# Patient Record
Sex: Female | Born: 2000 | Race: Black or African American | Hispanic: No | Marital: Single | Smoking: Never smoker
Health system: Southern US, Community
[De-identification: ages and names within clinical notes are randomized; demographics above are authoritative.]

## PROBLEM LIST (undated history)

## (undated) ENCOUNTER — Emergency Department (HOSPITAL_COMMUNITY): Admission: EM | Disposition: A | Discharge status: Other Acute Inpatient Hospital | Payer: Medicaid Other

## (undated) ENCOUNTER — Emergency Department (HOSPITAL_COMMUNITY): Admission: EM | Payer: Medicaid Other | Source: Home / Self Care

## (undated) DIAGNOSIS — E109 Type 1 diabetes mellitus without complications: Secondary | ICD-10-CM

---
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
## 2022-01-01 ENCOUNTER — Other Ambulatory Visit: Payer: Self-pay | Admitting: *Deleted

## 2022-01-01 ENCOUNTER — Telehealth: Payer: Self-pay | Admitting: Obstetrics and Gynecology

## 2022-01-01 DIAGNOSIS — A599 Trichomoniasis, unspecified: Secondary | ICD-10-CM

## 2022-01-01 DIAGNOSIS — E1065 Type 1 diabetes mellitus with hyperglycemia: Secondary | ICD-10-CM | POA: Diagnosis not present

## 2022-01-01 MED ORDER — METRONIDAZOLE 500 MG PO TABS: 500.0000 mg | ORAL_TABLET | Freq: Two times a day (BID) | ORAL | 0 refills | Status: DC

## 2022-01-01 NOTE — Telephone Encounter (Signed)
Patient called to say she had moved and lost her medication flagyl. She is requesting another prescription.

## 2022-01-02 ENCOUNTER — Ambulatory Visit (HOSPITAL_COMMUNITY): Payer: Self-pay

## 2022-01-20 ENCOUNTER — Emergency Department (HOSPITAL_COMMUNITY): Payer: Medicaid Other

## 2022-01-20 ENCOUNTER — Encounter (HOSPITAL_COMMUNITY): Payer: Self-pay

## 2022-01-20 ENCOUNTER — Emergency Department (HOSPITAL_COMMUNITY)
Admission: EM | Admit: 2022-01-20 | Discharge: 2022-01-20 | Discharge status: Against Medical Advice | Payer: Medicaid Other | Attending: Emergency Medicine | Admitting: Emergency Medicine

## 2022-01-20 DIAGNOSIS — R739 Hyperglycemia, unspecified: Secondary | ICD-10-CM | POA: Diagnosis not present

## 2022-01-20 DIAGNOSIS — R1030 Lower abdominal pain, unspecified: Secondary | ICD-10-CM | POA: Diagnosis not present

## 2022-01-20 DIAGNOSIS — N888 Other specified noninflammatory disorders of cervix uteri: Secondary | ICD-10-CM | POA: Diagnosis not present

## 2022-01-20 DIAGNOSIS — R102 Pelvic and perineal pain: Secondary | ICD-10-CM | POA: Diagnosis not present

## 2022-01-20 DIAGNOSIS — R1084 Generalized abdominal pain: Secondary | ICD-10-CM | POA: Diagnosis not present

## 2022-01-20 DIAGNOSIS — Z5321 Procedure and treatment not carried out due to patient leaving prior to being seen by health care provider: Secondary | ICD-10-CM | POA: Insufficient documentation

## 2022-01-20 DIAGNOSIS — R1111 Vomiting without nausea: Secondary | ICD-10-CM | POA: Diagnosis not present

## 2022-01-20 DIAGNOSIS — N838 Other noninflammatory disorders of ovary, fallopian tube and broad ligament: Secondary | ICD-10-CM | POA: Diagnosis not present

## 2022-01-20 DIAGNOSIS — I10 Essential (primary) hypertension: Secondary | ICD-10-CM | POA: Diagnosis not present

## 2022-01-20 LAB — URINALYSIS, ROUTINE W REFLEX MICROSCOPIC
Bilirubin Urine: NEGATIVE
Glucose, UA: >=500 mg/dL — AB
Ketones, ur: 80 mg/dL — AB
Nitrite: NEGATIVE
Protein, ur: 30 mg/dL — AB
RBC / HPF: >50 RBC/hpf — ABNORMAL HIGH (ref 0–5)
Specific Gravity, Urine: 1.028 (ref 1.005–1.030)
WBC, UA: >50 WBC/hpf — ABNORMAL HIGH (ref 0–5)
pH: 8 (ref 5.0–8.0)

## 2022-01-20 NOTE — ED Provider Triage Note (Signed)
Emergency Medicine Provider Triage Evaluation Note  Stacy Wood , a 21 y.o. female  was evaluated in triage.  Pt complains of pelvic pain since rough intercourse last night. Is on birth control. No discharge, dysuria. No hx of ovarian cysts. Denies nausea, vomiting at this time.  Review of Systems  Positive: Pelvic pain Negative: Nausea, vomiting, dysuria  Physical Exam  BP (!) 133/91 (BP Location: Left Arm)    Pulse 81    Temp 98 F (36.7 C) (Oral)    Resp 16    SpO2 100%  Gen:   Awake, no distress   Resp:  Normal effort  MSK:   Moves extremities without difficulty  Other:  TTP suprapubic, not more localized one side vs. The other  Medical Decision Making  Medically screening exam initiated at 1:28 PM.  Appropriate orders placed.  Stacy Wood was informed that the remainder of the evaluation will be completed by another provider, this initial triage assessment does not replace that evaluation, and the importance of remaining in the ED until their evaluation is complete.  Pelvic pain   Olene Floss, PA-C 01/20/22 1330

## 2022-01-20 NOTE — ED Triage Notes (Signed)
Per EMS, patient from walking on the street, c/o lower abdominal pain after intercourse yesterday. Pain worsened this morning per patient. Also reports vomiting today.  CBG 250

## 2022-01-30 DIAGNOSIS — E1065 Type 1 diabetes mellitus with hyperglycemia: Secondary | ICD-10-CM | POA: Diagnosis not present

## 2023-04-24 ENCOUNTER — Inpatient Hospital Stay (HOSPITAL_COMMUNITY)
Admission: EM | Admit: 2023-04-24 | Discharge: 2023-04-26 | DRG: 919 | Disposition: A | Discharge status: Home / Self Care | Payer: Medicaid Other | Attending: Infectious Diseases | Admitting: Infectious Diseases

## 2023-04-24 ENCOUNTER — Other Ambulatory Visit: Payer: Self-pay

## 2023-04-24 ENCOUNTER — Inpatient Hospital Stay (HOSPITAL_COMMUNITY): Payer: Medicaid Other

## 2023-04-24 ENCOUNTER — Emergency Department (HOSPITAL_COMMUNITY): Payer: Medicaid Other

## 2023-04-24 ENCOUNTER — Encounter (HOSPITAL_COMMUNITY): Payer: Self-pay

## 2023-04-24 DIAGNOSIS — Z825 Family history of asthma and other chronic lower respiratory diseases: Secondary | ICD-10-CM | POA: Diagnosis not present

## 2023-04-24 DIAGNOSIS — T383X6A Underdosing of insulin and oral hypoglycemic [antidiabetic] drugs, initial encounter: Secondary | ICD-10-CM | POA: Diagnosis present

## 2023-04-24 DIAGNOSIS — D509 Iron deficiency anemia, unspecified: Secondary | ICD-10-CM | POA: Diagnosis present

## 2023-04-24 DIAGNOSIS — T85614A Breakdown (mechanical) of insulin pump, initial encounter: Principal | ICD-10-CM | POA: Diagnosis present

## 2023-04-24 DIAGNOSIS — E875 Hyperkalemia: Secondary | ICD-10-CM | POA: Diagnosis present

## 2023-04-24 DIAGNOSIS — D72825 Bandemia: Secondary | ICD-10-CM | POA: Diagnosis present

## 2023-04-24 DIAGNOSIS — B3731 Acute candidiasis of vulva and vagina: Secondary | ICD-10-CM | POA: Diagnosis present

## 2023-04-24 DIAGNOSIS — Z9641 Presence of insulin pump (external) (internal): Secondary | ICD-10-CM | POA: Diagnosis present

## 2023-04-24 DIAGNOSIS — Z8249 Family history of ischemic heart disease and other diseases of the circulatory system: Secondary | ICD-10-CM

## 2023-04-24 DIAGNOSIS — Z833 Family history of diabetes mellitus: Secondary | ICD-10-CM

## 2023-04-24 DIAGNOSIS — E860 Dehydration: Secondary | ICD-10-CM | POA: Diagnosis present

## 2023-04-24 DIAGNOSIS — D72829 Elevated white blood cell count, unspecified: Secondary | ICD-10-CM | POA: Diagnosis not present

## 2023-04-24 DIAGNOSIS — E1110 Type 2 diabetes mellitus with ketoacidosis without coma: Secondary | ICD-10-CM | POA: Diagnosis present

## 2023-04-24 DIAGNOSIS — D75839 Thrombocytosis, unspecified: Secondary | ICD-10-CM | POA: Diagnosis present

## 2023-04-24 DIAGNOSIS — N390 Urinary tract infection, site not specified: Secondary | ICD-10-CM | POA: Diagnosis present

## 2023-04-24 DIAGNOSIS — E876 Hypokalemia: Secondary | ICD-10-CM | POA: Diagnosis present

## 2023-04-24 DIAGNOSIS — Z794 Long term (current) use of insulin: Secondary | ICD-10-CM | POA: Diagnosis not present

## 2023-04-24 DIAGNOSIS — N179 Acute kidney failure, unspecified: Secondary | ICD-10-CM | POA: Diagnosis not present

## 2023-04-24 DIAGNOSIS — Z79899 Other long term (current) drug therapy: Secondary | ICD-10-CM

## 2023-04-24 DIAGNOSIS — E874 Mixed disorder of acid-base balance: Secondary | ICD-10-CM | POA: Diagnosis not present

## 2023-04-24 DIAGNOSIS — E10649 Type 1 diabetes mellitus with hypoglycemia without coma: Secondary | ICD-10-CM

## 2023-04-24 DIAGNOSIS — E1010 Type 1 diabetes mellitus with ketoacidosis without coma: Secondary | ICD-10-CM | POA: Diagnosis present

## 2023-04-24 DIAGNOSIS — Z4681 Encounter for fitting and adjustment of insulin pump: Secondary | ICD-10-CM

## 2023-04-24 DIAGNOSIS — E1065 Type 1 diabetes mellitus with hyperglycemia: Secondary | ICD-10-CM | POA: Diagnosis present

## 2023-04-24 LAB — URINALYSIS, ROUTINE W REFLEX MICROSCOPIC
Bilirubin Urine: NEGATIVE
Glucose, UA: >=500 mg/dL — AB
Ketones, ur: 80 mg/dL — AB
Nitrite: NEGATIVE
Protein, ur: 100 mg/dL — AB
RBC / HPF: >50 RBC/hpf (ref 0–5)
Specific Gravity, Urine: 1.014 (ref 1.005–1.030)
WBC, UA: >50 WBC/hpf (ref 0–5)
pH: 5 (ref 5.0–8.0)

## 2023-04-24 LAB — BASIC METABOLIC PANEL
Anion gap: 15 (ref 5–15)
BUN: 13 mg/dL (ref 6–20)
CO2: 17 mmol/L — ABNORMAL LOW (ref 22–32)
Calcium: 9.4 mg/dL (ref 8.9–10.3)
Chloride: 111 mmol/L (ref 98–111)
Creatinine, Ser: 0.97 mg/dL (ref 0.44–1.00)
GFR, Estimated: >60 mL/min (ref >60)
Glucose, Bld: 129 mg/dL — ABNORMAL HIGH (ref 70–99)
Potassium: 4.5 mmol/L (ref 3.5–5.1)
Sodium: 143 mmol/L (ref 135–145)

## 2023-04-24 LAB — COMPREHENSIVE METABOLIC PANEL
ALT: UNDETERMINED U/L (ref 0–44)
AST: 25 U/L (ref 15–41)
Albumin: 3.8 g/dL (ref 3.5–5.0)
Alkaline Phosphatase: 153 U/L — ABNORMAL HIGH (ref 38–126)
BUN: 22 mg/dL — ABNORMAL HIGH (ref 6–20)
CO2: <7 mmol/L — ABNORMAL LOW (ref 22–32)
Calcium: 9.8 mg/dL (ref 8.9–10.3)
Chloride: 116 mmol/L — ABNORMAL HIGH (ref 98–111)
Creatinine, Ser: 1.69 mg/dL — ABNORMAL HIGH (ref 0.44–1.00)
GFR, Estimated: 44 mL/min — ABNORMAL LOW (ref >60)
Glucose, Bld: 255 mg/dL — ABNORMAL HIGH (ref 70–99)
Potassium: 5.3 mmol/L — ABNORMAL HIGH (ref 3.5–5.1)
Sodium: 145 mmol/L (ref 135–145)
Total Bilirubin: 1.9 mg/dL — ABNORMAL HIGH (ref 0.3–1.2)
Total Protein: 8.2 g/dL — ABNORMAL HIGH (ref 6.5–8.1)

## 2023-04-24 LAB — CBC WITH DIFFERENTIAL/PLATELET
Abs Immature Granulocytes: 0 10*3/uL (ref 0.00–0.07)
Basophils Absolute: 0 10*3/uL (ref 0.0–0.1)
Basophils Relative: 0 %
Eosinophils Absolute: 0.4 10*3/uL (ref 0.0–0.5)
Eosinophils Relative: 1 %
HCT: 48.6 % — ABNORMAL HIGH (ref 36.0–46.0)
Hemoglobin: 13.7 g/dL (ref 12.0–15.0)
Lymphocytes Relative: 11 %
Lymphs Abs: 4.5 10*3/uL — ABNORMAL HIGH (ref 0.7–4.0)
MCH: 24.5 pg — ABNORMAL LOW (ref 26.0–34.0)
MCHC: 28.2 g/dL — ABNORMAL LOW (ref 30.0–36.0)
MCV: 86.9 fL (ref 80.0–100.0)
Monocytes Absolute: 0.8 10*3/uL (ref 0.1–1.0)
Monocytes Relative: 2 %
Neutro Abs: 35.5 10*3/uL — ABNORMAL HIGH (ref 1.7–7.7)
Neutrophils Relative %: 86 %
Platelets: 411 10*3/uL — ABNORMAL HIGH (ref 150–400)
RBC: 5.59 MIL/uL — ABNORMAL HIGH (ref 3.87–5.11)
RDW: 13.4 % (ref 11.5–15.5)
WBC: 41.3 10*3/uL — ABNORMAL HIGH (ref 4.0–10.5)
nRBC: 0 % (ref 0.0–0.2)
nRBC: 0 /100 WBC

## 2023-04-24 LAB — I-STAT VENOUS BLOOD GAS, ED
Acid-base deficit: 26 mmol/L — ABNORMAL HIGH (ref 0.0–2.0)
Acid-base deficit: 20 mmol/L — ABNORMAL HIGH (ref 0.0–2.0)
Bicarbonate: 5.2 mmol/L — ABNORMAL LOW (ref 20.0–28.0)
Bicarbonate: 6.7 mmol/L — ABNORMAL LOW (ref 20.0–28.0)
Calcium, Ion: 1.08 mmol/L — ABNORMAL LOW (ref 1.15–1.40)
Calcium, Ion: 1.07 mmol/L — ABNORMAL LOW (ref 1.15–1.40)
HCT: 51 % — ABNORMAL HIGH (ref 36.0–46.0)
HCT: 47 % — ABNORMAL HIGH (ref 36.0–46.0)
Hemoglobin: 17.3 g/dL — ABNORMAL HIGH (ref 12.0–15.0)
Hemoglobin: 16 g/dL — ABNORMAL HIGH (ref 12.0–15.0)
O2 Saturation: 98 %
O2 Saturation: 56 %
Potassium: 7.3 mmol/L (ref 3.5–5.1)
Potassium: 4.7 mmol/L (ref 3.5–5.1)
Sodium: 137 mmol/L (ref 135–145)
Sodium: 144 mmol/L (ref 135–145)
TCO2: 6 mmol/L — ABNORMAL LOW (ref 22–32)
TCO2: 7 mmol/L — ABNORMAL LOW (ref 22–32)
pCO2, Ven: 23.7 mmHg — ABNORMAL LOW (ref 44–60)
pCO2, Ven: 19.4 mmHg — CL (ref 44–60)
pH, Ven: 6.946 — CL (ref 7.25–7.43)
pH, Ven: 7.15 — CL (ref 7.25–7.43)
pO2, Ven: 156 mmHg — ABNORMAL HIGH (ref 32–45)
pO2, Ven: 37 mmHg (ref 32–45)

## 2023-04-24 LAB — TECHNOLOGIST SMEAR REVIEW

## 2023-04-24 LAB — I-STAT CHEM 8, ED
BUN: 39 mg/dL — ABNORMAL HIGH (ref 6–20)
BUN: 25 mg/dL — ABNORMAL HIGH (ref 6–20)
Calcium, Ion: 1.1 mmol/L — ABNORMAL LOW (ref 1.15–1.40)
Calcium, Ion: 1.1 mmol/L — ABNORMAL LOW (ref 1.15–1.40)
Chloride: 116 mmol/L — ABNORMAL HIGH (ref 98–111)
Chloride: 120 mmol/L — ABNORMAL HIGH (ref 98–111)
Creatinine, Ser: 1.1 mg/dL — ABNORMAL HIGH (ref 0.44–1.00)
Creatinine, Ser: 0.9 mg/dL (ref 0.44–1.00)
Glucose, Bld: >700 mg/dL (ref 70–99)
Glucose, Bld: 330 mg/dL — ABNORMAL HIGH (ref 70–99)
HCT: 53 % — ABNORMAL HIGH (ref 36.0–46.0)
HCT: 48 % — ABNORMAL HIGH (ref 36.0–46.0)
Hemoglobin: 18 g/dL — ABNORMAL HIGH (ref 12.0–15.0)
Hemoglobin: 16.3 g/dL — ABNORMAL HIGH (ref 12.0–15.0)
Potassium: 8.1 mmol/L (ref 3.5–5.1)
Potassium: 4.8 mmol/L (ref 3.5–5.1)
Sodium: 139 mmol/L (ref 135–145)
Sodium: 146 mmol/L — ABNORMAL HIGH (ref 135–145)
TCO2: 7 mmol/L — ABNORMAL LOW (ref 22–32)
TCO2: 9 mmol/L — ABNORMAL LOW (ref 22–32)

## 2023-04-24 LAB — CBG MONITORING, ED
Glucose-Capillary: >600 mg/dL (ref 70–99)
Glucose-Capillary: 473 mg/dL — ABNORMAL HIGH (ref 70–99)
Glucose-Capillary: 313 mg/dL — ABNORMAL HIGH (ref 70–99)
Glucose-Capillary: 215 mg/dL — ABNORMAL HIGH (ref 70–99)
Glucose-Capillary: 165 mg/dL — ABNORMAL HIGH (ref 70–99)
Glucose-Capillary: 150 mg/dL — ABNORMAL HIGH (ref 70–99)
Glucose-Capillary: 181 mg/dL — ABNORMAL HIGH (ref 70–99)

## 2023-04-24 LAB — GLUCOSE, CAPILLARY
Glucose-Capillary: 182 mg/dL — ABNORMAL HIGH (ref 70–99)
Glucose-Capillary: 167 mg/dL — ABNORMAL HIGH (ref 70–99)
Glucose-Capillary: 118 mg/dL — ABNORMAL HIGH (ref 70–99)
Glucose-Capillary: 122 mg/dL — ABNORMAL HIGH (ref 70–99)
Glucose-Capillary: 129 mg/dL — ABNORMAL HIGH (ref 70–99)

## 2023-04-24 LAB — CBC
HCT: 52.7 % — ABNORMAL HIGH (ref 36.0–46.0)
Hemoglobin: 14.3 g/dL (ref 12.0–15.0)
MCH: 24.2 pg — ABNORMAL LOW (ref 26.0–34.0)
MCHC: 27.1 g/dL — ABNORMAL LOW (ref 30.0–36.0)
MCV: 89.3 fL (ref 80.0–100.0)
Platelets: 541 10*3/uL — ABNORMAL HIGH (ref 150–400)
RBC: 5.9 MIL/uL — ABNORMAL HIGH (ref 3.87–5.11)
RDW: 13.4 % (ref 11.5–15.5)
WBC: 53.3 10*3/uL (ref 4.0–10.5)
nRBC: 0 % (ref 0.0–0.2)

## 2023-04-24 LAB — BETA-HYDROXYBUTYRIC ACID: Beta-Hydroxybutyric Acid: >8 mmol/L — ABNORMAL HIGH (ref 0.05–0.27)

## 2023-04-24 LAB — I-STAT BETA HCG BLOOD, ED (MC, WL, AP ONLY): I-stat hCG, quantitative: 5.8 m[IU]/mL — ABNORMAL HIGH (ref <5)

## 2023-04-24 LAB — HCG, QUANTITATIVE, PREGNANCY: hCG, Beta Chain, Quant, S: 3 m[IU]/mL (ref <5)

## 2023-04-24 MED ORDER — LACTATED RINGERS IV BOLUS
1000.0000 mL | Freq: Once | INTRAVENOUS | Status: AC
  Administered 2023-04-24: 1000 mL via INTRAVENOUS

## 2023-04-24 MED ORDER — VANCOMYCIN HCL 1500 MG/300ML IV SOLN
1500.0000 mg | Freq: Once | INTRAVENOUS | Status: AC
  Administered 2023-04-24: 1500 mg via INTRAVENOUS
  Filled 2023-04-24: qty 300

## 2023-04-24 MED ORDER — LACTATED RINGERS IV BOLUS
1000.0000 mL | INTRAVENOUS | Status: AC
  Administered 2023-04-24: 1000 mL via INTRAVENOUS

## 2023-04-24 MED ORDER — ENOXAPARIN SODIUM 40 MG/0.4ML IJ SOSY
40.0000 mg | PREFILLED_SYRINGE | INTRAMUSCULAR | Status: DC
  Administered 2023-04-24 – 2023-04-25 (×2): 40 mg via SUBCUTANEOUS
  Filled 2023-04-24 (×2): qty 0.4

## 2023-04-24 MED ORDER — IOHEXOL 350 MG/ML SOLN
65.0000 mL | Freq: Once | INTRAVENOUS | Status: AC | PRN
  Administered 2023-04-24: 65 mL via INTRAVENOUS

## 2023-04-24 MED ORDER — FENTANYL CITRATE PF 50 MCG/ML IJ SOSY: 50.0000 ug | PREFILLED_SYRINGE | Freq: Once | INTRAMUSCULAR | Status: DC

## 2023-04-24 MED ORDER — PIPERACILLIN-TAZOBACTAM 3.375 G IVPB 30 MIN
3.3750 g | Freq: Once | INTRAVENOUS | Status: AC
  Administered 2023-04-24: 3.375 g via INTRAVENOUS
  Filled 2023-04-24: qty 50

## 2023-04-24 MED ORDER — ONDANSETRON HCL 4 MG/2ML IJ SOLN: 4.0000 mg | Freq: Once | INTRAMUSCULAR | Status: DC

## 2023-04-24 MED ORDER — INSULIN REGULAR(HUMAN) IN NACL 100-0.9 UT/100ML-% IV SOLN
INTRAVENOUS | Status: DC
  Administered 2023-04-24: 10.5 [IU]/h via INTRAVENOUS
  Filled 2023-04-24 (×2): qty 100

## 2023-04-24 MED ORDER — VANCOMYCIN VARIABLE DOSE PER UNSTABLE RENAL FUNCTION (PHARMACIST DOSING): Status: DC

## 2023-04-24 MED ORDER — ONDANSETRON HCL 4 MG/2ML IJ SOLN: 4.0000 mg | Freq: Four times a day (QID) | INTRAMUSCULAR | Status: DC | PRN

## 2023-04-24 MED ORDER — PIPERACILLIN-TAZOBACTAM 3.375 G IVPB
3.3750 g | Freq: Three times a day (TID) | INTRAVENOUS | Status: DC
  Administered 2023-04-24 – 2023-04-26 (×5): 3.375 g via INTRAVENOUS
  Filled 2023-04-24 (×5): qty 50

## 2023-04-24 MED ORDER — LACTATED RINGERS IV SOLN: INTRAVENOUS | Status: DC

## 2023-04-24 MED ORDER — DEXTROSE 50 % IV SOLN: 0.0000 mL | INTRAVENOUS | Status: DC | PRN

## 2023-04-24 MED ORDER — DEXTROSE IN LACTATED RINGERS 5 % IV SOLN: INTRAVENOUS | Status: DC

## 2023-04-24 NOTE — ED Notes (Signed)
MD notified of pt CBG of 165. Currently on insulin drip but unable to draw anion gap. Attempted three times to get CMP but has been hemolyzed. Waiting for new orders. Taken to CT by transporter.

## 2023-04-24 NOTE — ED Provider Notes (Signed)
[facility] EMERGENCY DEPARTMENT AT [facility] Provider Note   CSN: [id number] Arrival date & time: 04/24/23  4098     History  Chief Complaint  Patient presents with   Hyperglycemia   Fatigue    Stacy Wood is a 22 y.o. female.   Hyperglycemia    22 year old female presenting to the emergency department with concern for DKA.  The patient states that she ran out of her home insulin.  She is normally on an insulin pump.  She has had multiple admissions recently over the past few months for DKA.  She was brought in POV from home by a friend who had noted that she has been lethargic with increased labored breathing this morning.  Her POC blood glucose read high in triage.  She has had some episodes of nausea and vomiting, denies any pain or discomfort.  Endorses malaise, fatigue, polyuria and polydipsia.  Home Medications Prior to Admission medications   Medication Sig Start Date End Date Taking? Authorizing Provider  acetaminophen (TYLENOL) 325 MG tablet Take 2 tablets (650 mg total) by mouth every 6 (six) hours as needed. Do not take more than 4000mg  of tylenol per day Patient not taking: Reported on 01/12/2021 02/04/18   Couture, Cortni S, PA-C  Clindamycin Phos-Benzoyl Perox 1.2-3.75 % GEL Apply 1 application topically daily. Patient not taking: Reported on 02/20/2021 01/26/21   Lady Deutscher, MD  clindamycin-benzoyl peroxide [facility]) gel Apply topically daily. Patient not taking: Reported on 01/16/2021 01/12/21   Lady Deutscher, MD  Continuous Blood Gluc Receiver (DEXCOM G6 RECEIVER) DEVI 1 Device by Does not apply route daily as needed. Patient not taking: Reported on 01/16/2021 12/31/19   Gretchen Short, NP  Continuous Blood Gluc Sensor (DEXCOM G6 SENSOR) MISC 1 kit by Does not apply route daily as needed. Patient not taking: Reported on 01/16/2021 12/31/19   Gretchen Short, NP  Continuous Blood Gluc Transmit (DEXCOM G6 TRANSMITTER) MISC 1 kit by Does not apply route  daily as needed. Patient not taking: Reported on 01/16/2021 12/31/19   Gretchen Short, NP  diphenhydrAMINE (BENADRYL) 25 MG tablet Take 1 tablet (25 mg total) by mouth every 6 (six) hours as needed. Patient not taking: Reported on 01/16/2021 01/12/19   Lorin Picket, NP  famotidine (PEPCID) 20 MG tablet Take 1 tablet (20 mg total) by mouth 2 (two) times daily. Patient not taking: Reported on 12/29/2019 01/12/19   Lorin Picket, NP  glucagon (GLUCAGON EMERGENCY) 1 MG injection INJECT 1 MG IN ANTERIOR THIGH IF UNCONSCIOUS, UNRESPONSIVE, UNABLE TO SWALLOW AND/OR HAS A SEIZURE Patient not taking: Reported on 01/16/2021 09/17/19   Gretchen Short, NP  hydroquinone 4 % cream Apply topically 2 (two) times daily. Patient not taking: Reported on 12/18/2021 07/20/21   Lilland, Alana, DO  ibuprofen (ADVIL,MOTRIN) 400 MG tablet Take 1 tablet (400 mg total) by mouth every 6 (six) hours as needed. Patient not taking: Reported on 01/16/2021 02/04/18   Couture, Cortni S, PA-C  insulin aspart (NOVOLOG FLEXPEN) 100 UNIT/ML FlexPen INJECT UP TO 50 UNITS UNDER THE SKIN EVERY DAY AS DIRECTED 10/16/19   Gretchen Short, NP  Insulin Human (INSULIN PUMP) SOLN Inject into the skin continuous. humalog Patient not taking: Reported on 08/26/2020    [provider]  insulin lispro (HUMALOG) 100 UNIT/ML injection USE 200 UNITS IN INSULIN PUMP EVERY 48 HOURS FOR DKA AND HYPERGLYCEMIC PROTOCOL 11/28/20   Gretchen Short, NP  Insulin Pen Needle 31G X 5 MM MISC Inject insulin via  insulin pen 6 x daily Patient not taking: Reported on 01/16/2021 04/11/20   Gretchen Short, NP  lisinopril (ZESTRIL) 2.5 MG tablet Take 1 tablet (2.5 mg total) by mouth daily. Patient not taking: Reported on 01/12/2021 11/01/20   Gretchen Short, NP  metroNIDAZOLE (FLAGYL) 500 MG tablet Take 1 tablet (500 mg total) by mouth 2 (two) times daily. 01/01/22   Raelyn Mora, CNM  Norethindrone Acetate-Ethinyl Estrad-FE (LOESTRIN 24 FE) 1-20 MG-MCG(24) tablet  Take 1 tablet by mouth daily. Patient not taking: Reported on 12/18/2021 12/13/21   Brand Males, CNM  Norethindrone Acetate-Ethinyl Estradiol (LOESTRIN) 1.5-30 MG-MCG tablet Take 1 tablet by mouth daily. Patient not taking: Reported on 12/13/2021 01/12/21   Lady Deutscher, MD  tretinoin (RETIN-A) 0.025 % gel Apply topically at bedtime. Patient not taking: Reported on 12/13/2021 01/26/21   Lady Deutscher, MD  triamcinolone cream (KENALOG) 0.1 % Apply 1 application topically 2 (two) times daily. Patient not taking: Reported on 01/12/2021 01/12/19   Lorin Picket, NP      Allergies    Patient has no known allergies.    Review of Systems   Review of Systems  All other systems reviewed and are negative.   Physical Exam Updated Vital Signs BP 108/78   Pulse (!) 129   Temp 97.8 F (36.6 C) (Axillary)   Resp 19   Ht 5\' 2"  (1.575 m)   Wt 70.8 kg   SpO2 100%   BMI 28.53 kg/m  Physical Exam Vitals and nursing note reviewed.  Constitutional:      Appearance: She is well-developed. She is ill-appearing.     Comments: GCS14  HENT:     Head: Normocephalic and atraumatic.     Mouth/Throat:     Mouth: Mucous membranes are dry.  Eyes:     Conjunctiva/sclera: Conjunctivae normal.  Cardiovascular:     Rate and Rhythm: Regular rhythm. Tachycardia present.  Pulmonary:     Effort: Tachypnea present. No respiratory distress.     Breath sounds: Normal breath sounds.     Comments: Kussmaul respirations Abdominal:     Palpations: Abdomen is soft.     Tenderness: There is no abdominal tenderness.  Musculoskeletal:        General: No swelling.     Cervical back: Neck supple.  Skin:    General: Skin is warm and dry.     Capillary Refill: Capillary refill takes less than 2 seconds.  Neurological:     Mental Status: She is alert.  Psychiatric:        Mood and Affect: Mood normal.     ED Results / Procedures / Treatments   Labs (all labs ordered are listed, but only abnormal results are  displayed) Labs Reviewed  CBC - Abnormal; Notable for the following components:      Result Value   WBC 53.3 (*)    RBC 5.90 (*)    HCT 52.7 (*)    MCH 24.2 (*)    MCHC 27.1 (*)    Platelets 541 (*)    All other components within normal limits  BETA-HYDROXYBUTYRIC ACID - Abnormal; Notable for the following components:   Beta-Hydroxybutyric Acid >8.00 (*)    All other components within normal limits  CBC WITH DIFFERENTIAL/PLATELET - Abnormal; Notable for the following components:   WBC 41.3 (*)    RBC 5.59 (*)    HCT 48.6 (*)    MCH 24.5 (*)    MCHC 28.2 (*)    Platelets 411 (*)  Neutro Abs 35.5 (*)    Lymphs Abs 4.5 (*)    All other components within normal limits  COMPREHENSIVE METABOLIC PANEL - Abnormal; Notable for the following components:   Potassium 5.3 (*)    Chloride 116 (*)    CO2 <7 (*)    Glucose, Bld 255 (*)    BUN 22 (*)    Creatinine, Ser 1.69 (*)    Total Protein 8.2 (*)    Alkaline Phosphatase 153 (*)    Total Bilirubin 1.9 (*)    GFR, Estimated 44 (*)    All other components within normal limits  CBG MONITORING, ED - Abnormal; Notable for the following components:   Glucose-Capillary >600 (*)    All other components within normal limits  I-STAT BETA HCG BLOOD, ED ([facility], [facility], [facility] ONLY) - Abnormal; Notable for the following components:   I-stat hCG, quantitative 5.8 (*)    All other components within normal limits  I-STAT VENOUS BLOOD GAS, ED - Abnormal; Notable for the following components:   pH, Ven 6.946 (*)    pCO2, Ven 23.7 (*)    pO2, Ven 156 (*)    Bicarbonate 5.2 (*)    TCO2 6 (*)    Acid-base deficit 26.0 (*)    Potassium 7.3 (*)    Calcium, Ion 1.08 (*)    HCT 51.0 (*)    Hemoglobin 17.3 (*)    All other components within normal limits  I-STAT CHEM 8, ED - Abnormal; Notable for the following components:   Potassium 8.1 (*)    Chloride 116 (*)    BUN 39 (*)    Creatinine, Ser 1.10 (*)    Glucose, Bld >700 (*)    Calcium, Ion 1.10 (*)     TCO2 7 (*)    Hemoglobin 18.0 (*)    HCT 53.0 (*)    All other components within normal limits  I-STAT VENOUS BLOOD GAS, ED - Abnormal; Notable for the following components:   pH, Ven 7.150 (*)    pCO2, Ven 19.4 (*)    Bicarbonate 6.7 (*)    TCO2 7 (*)    Acid-base deficit 20.0 (*)    Calcium, Ion 1.07 (*)    HCT 47.0 (*)    Hemoglobin 16.0 (*)    All other components within normal limits  I-STAT CHEM 8, ED - Abnormal; Notable for the following components:   Sodium 146 (*)    Chloride 120 (*)    BUN 25 (*)    Glucose, Bld 330 (*)    Calcium, Ion 1.10 (*)    TCO2 9 (*)    Hemoglobin 16.3 (*)    HCT 48.0 (*)    All other components within normal limits  CBG MONITORING, ED - Abnormal; Notable for the following components:   Glucose-Capillary 473 (*)    All other components within normal limits  CBG MONITORING, ED - Abnormal; Notable for the following components:   Glucose-Capillary 313 (*)    All other components within normal limits  CBG MONITORING, ED - Abnormal; Notable for the following components:   Glucose-Capillary 215 (*)    All other components within normal limits  CBG MONITORING, ED - Abnormal; Notable for the following components:   Glucose-Capillary 165 (*)    All other components within normal limits  CULTURE, BLOOD (ROUTINE X 2)  CULTURE, BLOOD (ROUTINE X 2)  TECHNOLOGIST SMEAR REVIEW  HCG, QUANTITATIVE, PREGNANCY  URINALYSIS, ROUTINE W REFLEX MICROSCOPIC  BASIC METABOLIC PANEL  BASIC METABOLIC PANEL  BASIC METABOLIC PANEL  BASIC METABOLIC PANEL    EKG EKG Interpretation  Date/Time:  Wednesday Apr 24 2023 08:41:43 EDT Ventricular Rate:  142 PR Interval:  133 QRS Duration: 67 QT Interval:  293 QTC Calculation: 451 R Axis:   70 Text Interpretation: Sinus tachycardia LAE, consider biatrial enlargement Borderline T wave abnormalities Confirmed by Ernie Avena ([id number]) on 04/24/2023 8:53:13 AM  Radiology CT ABDOMEN PELVIS W CONTRAST  Result Date:  04/24/2023 CLINICAL DATA:  DKA.  Fatigue, nausea, and vomiting. EXAM: CT ABDOMEN AND PELVIS WITH CONTRAST TECHNIQUE: Multidetector CT imaging of the abdomen and pelvis was performed using the standard protocol following bolus administration of intravenous contrast. RADIATION DOSE REDUCTION: This exam was performed according to the departmental dose-optimization program which includes automated exposure control, adjustment of the mA and/or kV according to patient size and/or use of iterative reconstruction technique. CONTRAST:  65mL OMNIPAQUE IOHEXOL 350 MG/ML SOLN COMPARISON:  Right upper quadrant ultrasound from same day. FINDINGS: Lower chest: No acute abnormality. Hepatobiliary: No focal liver abnormality is seen. No gallstones, gallbladder wall thickening, or biliary dilatation. Pancreas: Unremarkable. No pancreatic ductal dilatation or surrounding inflammatory changes. Spleen: Normal in size without focal abnormality. Adrenals/Urinary Tract: Adrenal glands are unremarkable. Kidneys are normal, without renal calculi, focal lesion, or hydronephrosis. Bladder is unremarkable. Stomach/Bowel: Stomach is within normal limits. No evidence of bowel wall thickening, distention, or inflammatory changes. 1.1 cm appendicolith at the base of the appendix, which is otherwise normal in appearance without dilatation, wall thickening or surrounding inflammatory change. Vascular/Lymphatic: No significant vascular findings are present. No enlarged abdominal or pelvic lymph nodes. Reproductive: Uterus and bilateral adnexa are unremarkable. Other: No free fluid or pneumoperitoneum. Musculoskeletal: No acute or significant osseous findings. IMPRESSION: 1. No acute intra-abdominal process. 2. 1.1 cm appendicolith at the base of the appendix. No evidence of acute appendicitis. Electronically Signed   By: Obie Dredge M.D.   On: 04/24/2023 14:36   DG Chest Portable 1 View  Result Date: 04/24/2023 CLINICAL DATA:  Sepsis EXAM:  PORTABLE CHEST 1 VIEW COMPARISON:  10/27/2019 FINDINGS: The heart size and mediastinal contours are within normal limits. Both lungs are clear. The visualized skeletal structures are unremarkable. IMPRESSION: No active disease. Electronically Signed   By: Duanne Guess D.O.   On: 04/24/2023 13:27   US Abdomen Limited RUQ (LIVER/GB)  Result Date: 04/24/2023 CLINICAL DATA:  151471 RUQ pain 151471 EXAM: ULTRASOUND ABDOMEN LIMITED RIGHT UPPER QUADRANT COMPARISON:  None Available. FINDINGS: Gallbladder: No gallstones or wall thickening visualized. No sonographic Murphy sign noted by sonographer. Common bile duct: Diameter: 4 mm, normal.  No intrahepatic ductal dilation. Liver: No focal lesion identified. Within normal limits in parenchymal echogenicity. Portal vein is patent on color Doppler imaging with normal direction of blood flow towards the liver. Other: None. IMPRESSION: Normal right upper quadrant ultrasound. Electronically Signed   By: Caprice Renshaw M.D.   On: 04/24/2023 13:00    Procedures .Critical Care  Performed by: Ernie Avena, MD Authorized by: Ernie Avena, MD   Critical care provider statement:    Critical care time (minutes):  84   Critical care was necessary to treat or prevent imminent or life-threatening deterioration of the following conditions:  Endocrine crisis   Critical care was time spent personally by me on the following activities:  Development of treatment plan with patient or surrogate, discussions with consultants, evaluation of patient's response to treatment, examination of patient, ordering and review of laboratory studies, ordering and  review of radiographic studies, ordering and performing treatments and interventions, pulse oximetry, re-evaluation of patient's condition and review of old charts   Care discussed with: admitting provider       Medications Ordered in ED Medications  lactated ringers bolus 1,000 mL (0 mLs Intravenous Stopped 04/24/23 1127)   insulin regular, human (MYXREDLIN) 100 units/ 100 mL infusion (2 Units/hr Intravenous Rate/Dose Change 04/24/23 1230)  lactated ringers infusion (0 mLs Intravenous Stopped 04/24/23 1234)  dextrose 5 % in lactated ringers infusion ( Intravenous New Bag/Given 04/24/23 1236)  dextrose 50 % solution 0-50 mL (has no administration in time range)  fentaNYL (SUBLIMAZE) injection 50 mcg (50 mcg Intravenous Patient Refused/Not Given 04/24/23 1403)  ondansetron (ZOFRAN) injection 4 mg (4 mg Intravenous Patient Refused/Not Given 04/24/23 1403)  enoxaparin (LOVENOX) injection 40 mg (has no administration in time range)  vancomycin (VANCOREADY) IVPB 1500 mg/300 mL (has no administration in time range)  piperacillin-tazobactam (ZOSYN) IVPB 3.375 g (has no administration in time range)  vancomycin variable dose per unstable renal function (pharmacist dosing) (has no administration in time range)  piperacillin-tazobactam (ZOSYN) IVPB 3.375 g (has no administration in time range)  lactated ringers bolus 1,000 mL (0 mLs Intravenous Stopped 04/24/23 0958)  iohexol (OMNIPAQUE) 350 MG/ML injection 65 mL (65 mLs Intravenous Contrast Given 04/24/23 1422)    ED Course/ Medical Decision Making/ A&P Clinical Course as of 04/24/23 1446  Wed Apr 24, 2023  0915 pH, Ven(!!): 6.946 [JL]  0915 Potassium(!!): 7.3 [JL]  0915 Glucose-Capillary(!!): >600 [JL]  1229 WBC(!): 41.3 [JL]  1229 NEUT#(!): 35.5 [JL]  1229 WBC Morphology: See Note [JL]  1341 Beta-Hydroxybutyric Acid(!): >8.00 [JL]    Clinical Course User Index [JL] Ernie Avena, MD                             Medical Decision Making Amount and/or Complexity of Data Reviewed Labs: ordered. Decision-making details documented in ED Course. Radiology: ordered.  Risk Prescription drug management. Decision regarding hospitalization.    22 year old female presenting to the emergency department with concern for DKA.  The patient states that she ran out of her home  insulin.  She is normally on an insulin pump.  She has had multiple admissions recently over the past few months for DKA.  She was brought in POV from home by a friend who had noted that she has been lethargic with increased labored breathing this morning.  Her POC blood glucose read high in triage.  She has had some episodes of nausea and vomiting, denies any pain or discomfort.  Endorses malaise, fatigue, polyuria and polydipsia.   On arrival, the patient was afebrile, tachycardic heart rate 149, tachypneic RR 31, Kussmaul respirations noted, sinus tachycardia noted on cardiac telemetry, hemodynamically stable BP 116/79, saturating 99% on room air.  Physical exam significant for Kussmaul respirations, dry mucous membranes, ill-appearing female GCS 14.  Initial CBG was reading high at greater than 600.  A VBG was obtained, IV access was obtained and the patient was started on 2 L for IV fluid resuscitation via rapid bolus.  Initial VBG revealed a significant acidosis with a pH of 6.95, hyperkalemia with a potassium of 7.3.  Her EKG revealed sinus tachycardia, ventricular rate 142, no significantly peaked T waves.  Nonspecific T changes present.  No STEMI.  Given the patient's VBG reading, the patient was started on an IV insulin drip per the DKA Endo tool protocol.  Patient  improving on repeat blood gases and rechecks of her potassium, mental status improved following fluid resuscitation and initiation of an insulin drip.  Repeat assessment, the patient had mild right upper quadrant tenderness to palpation, negative Murphy sign however given her leukocytosis will evaluate further with a right upper quadrant ultrasound: IMPRESSION:  Normal right upper quadrant ultrasound.   CT of the abdomen pelvis was performed to look for infectious etiology in addition to chest x-ray.  Suspect likely reactive leukocytosis, concern for potential infectious abnormality.  Unable to obtain urine schedule due to severe  dehydration.  Will order in and out catheterization.  CXR: Unremarkable  CT Abdomen Pelvis: IMPRESSION:  1. No acute intra-abdominal process.  2. 1.1 cm appendicolith at the base of the appendix. No evidence of  acute appendicitis.    No evidence for infectious etiology at this time.  Patient presenting with DKA and likely reactive leukocytosis, urinalysis was pending.  Did consult general medicine teaching service which accepted the patient in admission.    Final Clinical Impression(s) / ED Diagnoses Final diagnoses:  Type 1 diabetes mellitus with ketoacidosis without coma (HCC)  Hyperkalemia  Bandemia  Leukocytosis, unspecified type  AKI (acute kidney injury) [facility])    Rx / DC Orders ED Discharge Orders     None         Ernie Avena, MD 04/24/23 1446

## 2023-04-24 NOTE — Inpatient Diabetes Management (Addendum)
Inpatient Diabetes Program Recommendations  AACE/ADA: New Consensus Statement on Inpatient Glycemic Control (2015)  Target Ranges:  Prepandial:   less than 140 mg/dL      Peak postprandial:   less than 180 mg/dL (1-2 hours)      Critically ill patients:  140 - 180 mg/dL   Lab Results  Component Value Date   GLUCAP >600 (HH) 04/24/2023   HGBA1C 13.5 (A) 08/26/2020   Diabetes history: DM1  Outpatient Diabetes medications: Insulin pump Current orders for Inpatient glycemic control: IV insulin with Endotool   Inpatient Diabetes Program Recommendations:   CO2 and anion gap not resulted yet. Patient currently in ED and patient told the doctor she ran out of insulin. Patient has 1 ED admission here in [facility] over the past 6 months, and also multiple ED and admissions in [facility] over the past 6 months.  "Manuela Schwartz, NP - 08/07/2021 11:30 AM EDT  Lerry Liner [facility]  Basal I:C ratio Sensitivity Target BG 12a 1.4 units/hr 6 30 12A-6A: 160 4a 1.7 units/hr 6 30 6A-9P 120 3p 1.95 units/hr 6 30 9P-12A: 160   Basal per day 41.8 units per day"  Attempted to speak with patient but groggy and unable to discuss what happened with her insulin pump. Patient has 2 persons sitting with her and shared that patient changed her insulin pump set last hs prior to going to bed, slept all night and woke up this am with elevated CBGs.States pt. Has her insulin pump with her and has additional insulin as needed.  Thank you, Billy Fischer. Christophr Calix, RN, MSN, CDE  Diabetes Coordinator Inpatient Glycemic Control Team Team Pager [phone] (8am-5pm) 04/24/2023 10:11 AM

## 2023-04-24 NOTE — ED Notes (Signed)
Phlebotomist is at bedside drawing CMP. RN collected CMP twice but were both hemolyzed. Will continue to monitor.

## 2023-04-24 NOTE — ED Notes (Signed)
Spoke with Dr.Braswell at this time and informed him that RN and phlebotomist attempted multiple times for blood cultures and CMP with no success. Also, MD notified of pt CBG of 150 but unable to get anion gap. Per MD, continue insulin drip for tonight and start antibiotics without blood cultures. Will continue to monitor.

## 2023-04-24 NOTE — ED Notes (Signed)
ED TO INPATIENT HANDOFF REPORT  ED Nurse Name and Phone #: Waunita Schooner Name/Age/Gender Stacy Wood 22 y.o. female Room/Bed: 032C/032C  Code Status   Code Status: Full Code  Home/SNF/Other Home Patient oriented to: self, place, time, and situation Is this baseline? Yes      Chief Complaint DKA (diabetic ketoacidosis) (HCC) [E11.10]  Triage Note Pt arrives via POV from home with friend. Friend states patient was very lethargic this morning and had some nausea and vomiting. PT is a diabetic. She states her blood sugar was elevated this morning. Pt is lethargic, has labored breathing and POC BG reads HI in triage.    Allergies No Known Allergies  Level of Care/Admitting Diagnosis ED Disposition     ED Disposition  Admit   Condition  --   Comment  Hospital Area: [facility] [[id number]]  Level of Care: Progressive [102]  Admit to Progressive based on following criteria: ACUTE MENTAL DISORDER-RELATED Drug/Alcohol Ingestion/Overdose/Withdrawal, Suicidal Ideation/attempt requiring safety sitter and < Q2h monitoring/assessments, moderate to severe agitation that is managed with medication/sitter, CIWA-Ar score < 20.  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 No pertinent surgical history.   A IV Location/Drains/Wounds Patient  Lines/Drains/Airways Status     Active Line/Drains/Airways     Name Placement date Placement time Site Days   Peripheral IV 04/24/23 20 G Posterior;Right Hand 04/24/23  0853  Hand  less than 1            Intake/Output Last 24 hours  Intake/Output Summary (Last 24 hours) at 04/24/2023 1629 Last data filed at 04/24/2023 1535 Gross per 24 hour  Intake 2050 ml  Output --  Net 2050 ml    Labs/Imaging Results for orders placed or performed during the hospital encounter of 04/24/23 (from the past 48 hour(s))  CBG monitoring, ED     Status: Abnormal   Collection Time: 04/24/23  8:32 AM  Result Value Ref Range   Glucose-Capillary >600 (HH) 70 - 99 mg/dL    Comment: Glucose reference range applies only to samples taken after fasting for at least 8 hours.  CBC     Status: Abnormal   Collection Time: 04/24/23  8:54 AM  Result Value Ref Range   WBC 53.3 (HH) 4.0 - 10.5 K/uL    Comment: REPEATED TO VERIFY THIS CRITICAL RESULT HAS VERIFIED AND BEEN CALLED TO Deberah Pelton, RN BY [employer] ON 05 15 2024 AT 0949, AND HAS BEEN READ BACK.     RBC 5.90 (H) 3.87 - 5.11 MIL/uL   Hemoglobin 14.3 12.0 - 15.0 g/dL   HCT 04.5 (H) 40.9 - 81.1 %   MCV 89.3 80.0 - 100.0 fL   MCH 24.2 (L) 26.0 - 34.0 pg   MCHC 27.1 (L) 30.0 - 36.0 g/dL   RDW 91.4 78.2 - 95.6 %   Platelets  541 (H) 150 - 400 K/uL   nRBC 0.0 0.0 - 0.2 %    Comment: Performed at [facility], [address] [place], [place] [address]  Beta-hydroxybutyric acid     Status: Abnormal   Collection Time: 04/24/23  8:54 AM  Result Value Ref Range   Beta-Hydroxybutyric Acid >8.00 (H) 0.05 - 0.27 mmol/L    Comment: RESULT CONFIRMED BY MANUAL DILUTION Performed at [facility], [address] [place], [place] [address]   I-Stat beta hCG blood, ED     Status: Abnormal   Collection Time: 04/24/23  9:10 AM  Result Value Ref Range   I-stat hCG, quantitative 5.8 (H) <5 mIU/mL   Comment 3            Comment:   GEST.  AGE      CONC.  (mIU/mL)   <=1 WEEK        5 - 50     2 WEEKS       50 - 500     3 WEEKS       100 - 10,000     4 WEEKS     1,000 - 30,000        FEMALE AND NON-PREGNANT FEMALE:     LESS THAN 5 mIU/mL   I-Stat venous blood gas, ([facility], [facility], [facility])     Status: Abnormal   Collection Time: 04/24/23  9:12 AM  Result Value Ref Range   pH, Ven 6.946 (LL) 7.25 - 7.43   pCO2, Ven 23.7 (L) 44 - 60 mmHg   pO2, Ven 156 (H) 32 - 45 mmHg   Bicarbonate 5.2 (L) 20.0 - 28.0 mmol/L   TCO2 6 (L) 22 - 32 mmol/L   O2 Saturation 98 %   Acid-base deficit 26.0 (H) 0.0 - 2.0 mmol/L   Sodium 137 135 - 145 mmol/L   Potassium 7.3 (HH) 3.5 - 5.1 mmol/L   Calcium, Ion 1.08 (L) 1.15 - 1.40 mmol/L   HCT 51.0 (H) 36.0 - 46.0 %   Hemoglobin 17.3 (H) 12.0 - 15.0 g/dL   Sample type VENOUS    Comment NOTIFIED PHYSICIAN   I-stat chem 8, ED (not at [facility], [facility] or [facility])     Status: Abnormal   Collection Time: 04/24/23  9:33 AM  Result Value Ref Range   Sodium 139 135 - 145 mmol/L   Potassium 8.1 (HH) 3.5 - 5.1 mmol/L   Chloride 116 (H) 98 - 111 mmol/L   BUN 39 (H) 6 - 20 mg/dL   Creatinine, Ser 0.98 (H) 0.44 - 1.00 mg/dL   Glucose, Bld >119 (HH) 70 - 99 mg/dL    Comment: Glucose reference range applies only to samples taken after fasting for at least 8 hours.   Calcium, Ion 1.10 (L) 1.15 - 1.40 mmol/L   TCO2 7 (L) 22 - 32 mmol/L   Hemoglobin 18.0 (H) 12.0 - 15.0 g/dL   HCT 14.7 (H) 82.9 - 56.2 %   Comment NOTIFIED PHYSICIAN   Technologist smear review     Status: None   Collection Time: 04/24/23 10:10 AM  Result Value Ref Range   WBC MORPHOLOGY MORPHOLOGY UNREMARKABLE    RBC MORPHOLOGY MORPHOLOGY UNREMARKABLE    Plt Morphology MORPHOLOGY UNREMARKABLE    Clinical Information WBC >50     Comment: Performed at [facility], [address], [place], [place] [address]  hCG, quantitative, pregnancy     Status: None  Collection Time: 04/24/23 10:10 AM  Result Value Ref Range   hCG, Beta Chain, Quant, S 3 <5  mIU/mL    Comment:          GEST. AGE      CONC.  (mIU/mL)   <=1 WEEK        5 - 50     2 WEEKS       50 - 500     3 WEEKS       100 - 10,000     4 WEEKS     1,000 - 30,000     5 WEEKS     3,500 - 115,000   6-8 WEEKS     12,000 - 270,000    12 WEEKS     15,000 - 220,000        FEMALE AND NON-PREGNANT FEMALE:     LESS THAN 5 mIU/mL Performed at [facility], [address], [place], [place] [address]   CBC with Differential     Status: Abnormal   Collection Time: 04/24/23 10:10 AM  Result Value Ref Range   WBC 41.3 (H) 4.0 - 10.5 K/uL   RBC 5.59 (H) 3.87 - 5.11 MIL/uL   Hemoglobin 13.7 12.0 - 15.0 g/dL   HCT 74.2 (H) 59.5 - 63.8 %   MCV 86.9 80.0 - 100.0 fL   MCH 24.5 (L) 26.0 - 34.0 pg   MCHC 28.2 (L) 30.0 - 36.0 g/dL   RDW 75.6 43.3 - 29.5 %   Platelets 411 (H) 150 - 400 K/uL   nRBC 0.0 0.0 - 0.2 %   Neutrophils Relative % 86 %   Neutro Abs 35.5 (H) 1.7 - 7.7 K/uL   Lymphocytes Relative 11 %   Lymphs Abs 4.5 (H) 0.7 - 4.0 K/uL   Monocytes Relative 2 %   Monocytes Absolute 0.8 0.1 - 1.0 K/uL   Eosinophils Relative 1 %   Eosinophils Absolute 0.4 0.0 - 0.5 K/uL   Basophils Relative 0 %   Basophils Absolute 0.0 0.0 - 0.1 K/uL   WBC Morphology See Note     Comment: Increased Bands. >20% Bands  Mild Left Shift. 1 to 5% Metas, occ myelo    nRBC 0 0 /100 WBC   Abs Immature Granulocytes 0.00 0.00 - 0.07 K/uL    Comment: Performed at [facility], [address], [place], [place] [address]  CBG monitoring, ED     Status: Abnormal   Collection Time: 04/24/23 10:29 AM  Result Value Ref Range   Glucose-Capillary 473 (H) 70 - 99 mg/dL    Comment: Glucose reference range applies only to samples taken after fasting for at least 8 hours.  CBG monitoring, ED     Status: Abnormal   Collection Time: 04/24/23 11:35 AM  Result Value Ref Range   Glucose-Capillary 313 (H) 70 - 99 mg/dL    Comment: Glucose reference range applies only to samples taken after fasting for at  least 8 hours.  I-Stat venous blood gas, ([facility], [facility], [facility])     Status: Abnormal   Collection Time: 04/24/23 11:42 AM  Result Value Ref Range   pH, Ven 7.150 (LL) 7.25 - 7.43   pCO2, Ven 19.4 (LL) 44 - 60 mmHg   pO2, Ven 37 32 - 45 mmHg   Bicarbonate 6.7 (L) 20.0 - 28.0 mmol/L   TCO2 7 (L) 22 - 32 mmol/L   O2 Saturation 56 %   Acid-base deficit  20.0 (H) 0.0 - 2.0 mmol/L   Sodium 144 135 - 145 mmol/L   Potassium 4.7 3.5 - 5.1 mmol/L   Calcium, Ion 1.07 (L) 1.15 - 1.40 mmol/L   HCT 47.0 (H) 36.0 - 46.0 %   Hemoglobin 16.0 (H) 12.0 - 15.0 g/dL   Sample type VENOUS    Comment NOTIFIED PHYSICIAN   I-stat chem 8, ED (not at [facility], [facility] or [facility])     Status: Abnormal   Collection Time: 04/24/23 11:43 AM  Result Value Ref Range   Sodium 146 (H) 135 - 145 mmol/L   Potassium 4.8 3.5 - 5.1 mmol/L   Chloride 120 (H) 98 - 111 mmol/L   BUN 25 (H) 6 - 20 mg/dL   Creatinine, Ser 1.61 0.44 - 1.00 mg/dL   Glucose, Bld 096 (H) 70 - 99 mg/dL    Comment: Glucose reference range applies only to samples taken after fasting for at least 8 hours.   Calcium, Ion 1.10 (L) 1.15 - 1.40 mmol/L   TCO2 9 (L) 22 - 32 mmol/L   Hemoglobin 16.3 (H) 12.0 - 15.0 g/dL   HCT 04.5 (H) 40.9 - 81.1 %  Comprehensive metabolic panel     Status: Abnormal   Collection Time: 04/24/23 12:01 PM  Result Value Ref Range   Sodium 145 135 - 145 mmol/L   Potassium 5.3 (H) 3.5 - 5.1 mmol/L   Chloride 116 (H) 98 - 111 mmol/L   CO2 <7 (L) 22 - 32 mmol/L   Glucose, Bld 255 (H) 70 - 99 mg/dL    Comment: Glucose reference range applies only to samples taken after fasting for at least 8 hours.   BUN 22 (H) 6 - 20 mg/dL   Creatinine, Ser 9.14 (H) 0.44 - 1.00 mg/dL   Calcium 9.8 8.9 - 78.2 mg/dL   Total Protein 8.2 (H) 6.5 - 8.1 g/dL   Albumin 3.8 3.5 - 5.0 g/dL   AST 25 15 - 41 U/L   ALT QUANTITY NOT SUFFICIENT, UNABLE TO PERFORM TEST 0 - 44 U/L    Comment: NOTIFIED Deberah Pelton, RN @ [phone] 04/24/23 BY SEKDAHL   Alkaline Phosphatase  153 (H) 38 - 126 U/L   Total Bilirubin 1.9 (H) 0.3 - 1.2 mg/dL   GFR, Estimated 44 (L) >60 mL/min    Comment: (NOTE) Calculated using the CKD-EPI Creatinine Equation (2021)    Anion gap NOT CALCULATED 5 - 15    Comment: Performed at [facility], [address] [place], [place] [address]  CBG monitoring, ED     Status: Abnormal   Collection Time: 04/24/23 12:32 PM  Result Value Ref Range   Glucose-Capillary 215 (H) 70 - 99 mg/dL    Comment: Glucose reference range applies only to samples taken after fasting for at least 8 hours.  CBG monitoring, ED     Status: Abnormal   Collection Time: 04/24/23  1:46 PM  Result Value Ref Range   Glucose-Capillary 165 (H) 70 - 99 mg/dL    Comment: Glucose reference range applies only to samples taken after fasting for at least 8 hours.  Urinalysis, Routine w reflex microscopic -Urine, Clean Catch     Status: Abnormal   Collection Time: 04/24/23  2:00 PM  Result Value Ref Range   Color, Urine YELLOW YELLOW   APPearance CLOUDY (A) CLEAR   Specific Gravity, Urine 1.014 1.005 - 1.030   pH 5.0 5.0 - 8.0   Glucose, UA >=500 (A) NEGATIVE mg/dL  Hgb urine dipstick LARGE (A) NEGATIVE   Bilirubin Urine NEGATIVE NEGATIVE   Ketones, ur 80 (A) NEGATIVE mg/dL   Protein, ur 161 (A) NEGATIVE mg/dL   Nitrite NEGATIVE NEGATIVE   Leukocytes,Ua LARGE (A) NEGATIVE   RBC / HPF >50 0 - 5 RBC/hpf   WBC, UA >50 0 - 5 WBC/hpf   Bacteria, UA MANY (A) NONE SEEN   Squamous Epithelial / HPF 0-5 0 - 5 /HPF   Mucus PRESENT    Budding Yeast PRESENT     Comment: Performed at [facility], [address] [place], [place] [address]  CBG monitoring, ED     Status: Abnormal   Collection Time: 04/24/23  3:02 PM  Result Value Ref Range   Glucose-Capillary 150 (H) 70 - 99 mg/dL    Comment: Glucose reference range applies only to samples taken after fasting for at least 8 hours.   CT ABDOMEN PELVIS W CONTRAST  Result Date: 04/24/2023 CLINICAL DATA:  DKA.   Fatigue, nausea, and vomiting. EXAM: CT ABDOMEN AND PELVIS WITH CONTRAST TECHNIQUE: Multidetector CT imaging of the abdomen and pelvis was performed using the standard protocol following bolus administration of intravenous contrast. RADIATION DOSE REDUCTION: This exam was performed according to the departmental dose-optimization program which includes automated exposure control, adjustment of the Kindsey Eblin and/or kV according to patient size and/or use of iterative reconstruction technique. CONTRAST:  65mL OMNIPAQUE IOHEXOL 350 MG/ML SOLN COMPARISON:  Right upper quadrant ultrasound from same day. FINDINGS: Lower chest: No acute abnormality. Hepatobiliary: No focal liver abnormality is seen. No gallstones, gallbladder wall thickening, or biliary dilatation. Pancreas: Unremarkable. No pancreatic ductal dilatation or surrounding inflammatory changes. Spleen: Normal in size without focal abnormality. Adrenals/Urinary Tract: Adrenal glands are unremarkable. Kidneys are normal, without renal calculi, focal lesion, or hydronephrosis. Bladder is unremarkable. Stomach/Bowel: Stomach is within normal limits. No evidence of bowel wall thickening, distention, or inflammatory changes. 1.1 cm appendicolith at the base of the appendix, which is otherwise normal in appearance without dilatation, wall thickening or surrounding inflammatory change. Vascular/Lymphatic: No significant vascular findings are present. No enlarged abdominal or pelvic lymph nodes. Reproductive: Uterus and bilateral adnexa are unremarkable. Other: No free fluid or pneumoperitoneum. Musculoskeletal: No acute or significant osseous findings. IMPRESSION: 1. No acute intra-abdominal process. 2. 1.1 cm appendicolith at the base of the appendix. No evidence of acute appendicitis. Electronically Signed   By: Obie Dredge M.D.   On: 04/24/2023 14:36   DG Chest Portable 1 View  Result Date: 04/24/2023 CLINICAL DATA:  Sepsis EXAM: PORTABLE CHEST 1 VIEW COMPARISON:   10/27/2019 FINDINGS: The heart size and mediastinal contours are within normal limits. Both lungs are clear. The visualized skeletal structures are unremarkable. IMPRESSION: No active disease. Electronically Signed   By: Duanne Guess D.O.   On: 04/24/2023 13:27   US Abdomen Limited RUQ (LIVER/GB)  Result Date: 04/24/2023 CLINICAL DATA:  151471 RUQ pain 151471 EXAM: ULTRASOUND ABDOMEN LIMITED RIGHT UPPER QUADRANT COMPARISON:  None Available. FINDINGS: Gallbladder: No gallstones or wall thickening visualized. No sonographic Murphy sign noted by sonographer. Common bile duct: Diameter: 4 mm, normal.  No intrahepatic ductal dilation. Liver: No focal lesion identified. Within normal limits in parenchymal echogenicity. Portal vein is patent on color Doppler imaging with normal direction of blood flow towards the liver. Other: None. IMPRESSION: Normal right upper quadrant ultrasound. Electronically Signed   By: Caprice Renshaw M.D.   On: 04/24/2023 13:00    Pending Labs Unresulted Labs (From admission, onward)  Start     Ordered   04/25/23 0500  HIV Antibody (routine testing w rflx)  (HIV Antibody (Routine testing w reflex) panel)  Tomorrow morning,   R        04/24/23 1323   04/25/23 0500  Magnesium  Tomorrow morning,   R        04/24/23 1628   04/25/23 0500  Phosphorus  Tomorrow morning,   R        04/24/23 1628   04/25/23 0500  CBC with Differential/Platelet  Tomorrow morning,   R        04/24/23 1628   04/24/23 1612  Urinalysis, w/ Reflex to Culture (Infection Suspected) -Urine, Clean Catch  (Urine Labs)  Add-on,   AD       Question:  Specimen Source  Answer:  Urine, Clean Catch   04/24/23 1611   04/24/23 1357  Basic metabolic panel  (Diabetes Ketoacidosis (DKA))  STAT Now then every 4 hours ,   R (with STAT occurrences)      04/24/23 1356   04/24/23 1324  Culture, blood (Routine X 2) w Reflex to ID Panel  BLOOD CULTURE X 2,   R (with TIMED occurrences)      04/24/23 1323             Vitals/Pain Today's Vitals   04/24/23 1430 04/24/23 1508 04/24/23 1515 04/24/23 1530  BP: 99/80  114/82 104/83  Pulse: (!) 127  (!) 118 (!) 119  Resp: 20  16 14   Temp:  98 F (36.7 C)    TempSrc:  Axillary    SpO2: 100%  100% 100%  Weight:      Height:      PainSc:        Isolation Precautions No active isolations  Medications Medications  lactated ringers bolus 1,000 mL (0 mLs Intravenous Stopped 04/24/23 1127)  insulin regular, human (MYXREDLIN) 100 units/ 100 mL infusion (1.5 Units/hr Intravenous Rate/Dose Change 04/24/23 1509)  lactated ringers infusion (0 mLs Intravenous Stopped 04/24/23 1234)  dextrose 5 % in lactated ringers infusion ( Intravenous New Bag/Given 04/24/23 1236)  dextrose 50 % solution 0-50 mL (has no administration in time range)  ondansetron (ZOFRAN) injection 4 mg (4 mg Intravenous Patient Refused/Not Given 04/24/23 1403)  enoxaparin (LOVENOX) injection 40 mg (has no administration in time range)  vancomycin (VANCOREADY) IVPB 1500 mg/300 mL (1,500 mg Intravenous New Bag/Given 04/24/23 1542)  vancomycin variable dose per unstable renal function (pharmacist dosing) (has no administration in time range)  piperacillin-tazobactam (ZOSYN) IVPB 3.375 g (has no administration in time range)  lactated ringers bolus 1,000 mL (0 mLs Intravenous Stopped 04/24/23 0958)  piperacillin-tazobactam (ZOSYN) IVPB 3.375 g (0 g Intravenous Stopped 04/24/23 1535)  iohexol (OMNIPAQUE) 350 MG/ML injection 65 mL (65 mLs Intravenous Contrast Given 04/24/23 1422)    Mobility walks     Focused Assessments Neuro Assessment Handoff:  Swallow screen pass? Yes          Neuro Assessment:   Neuro Checks:      Has TPA been given? No If patient is a Neuro Trauma and patient is going to OR before floor call report to 4N Charge nurse: [phone] or [phone]   R Recommendations: See Admitting Provider Note  Report given to:   Additional Notes: Designer, fashion/clothing, [phone]

## 2023-04-24 NOTE — ED Triage Notes (Signed)
Pt arrives via POV from home with friend. Friend states patient was very lethargic this morning and had some nausea and vomiting. PT is a diabetic. She states her blood sugar was elevated this morning. Pt is lethargic, has labored breathing and POC BG reads HI in triage.

## 2023-04-24 NOTE — Progress Notes (Signed)
Pharmacy Antibiotic Note  Stacy Wood is a 22 y.o. female for which pharmacy has been consulted for vancomycin and zosyn dosing for sepsis.  Patient with a history of T1DM. Patient presenting with lethargy, now concern for DKA after running out of insulin.  SCr 1.69 - ~0.6-1 baseline, currently with AKI (CrCl 49.4 ml/min) WBC 41.3; LA not collected; T 97.31F; HR 130s; RR 18 No COVID//flu/ RSV   Plan: Give vancomycin 1500  mg load Vancomycin variable dosing for unstable renal function Zosyn 3.375g every 8 hours Trend WBC, Fever, Renal function F/u cultures, clinical course, WBC De-escalate when able F/u MRSA PCR  Weight: 70.8 kg (156 lb)  Temp (24hrs), Avg:98.1 F (36.7 C), Min:97.8 F (36.6 C), Max:98.3 F (36.8 C)  Recent Labs  Lab 04/24/23 0854 04/24/23 0933 04/24/23 1010 04/24/23 1143 04/24/23 1201  WBC 53.3*  --  41.3*  --   --   CREATININE  --  1.10*  --  0.90 1.69*    CrCl cannot be calculated (Unknown ideal weight.).    No Known Allergies  Antimicrobials this admission: Vancomycin 5/15 >>  Zosyn 5/15 >>   Microbiology results: Pending  Thank you for involving pharmacy in this patient's care.  Enos Fling, PharmD PGY2 Pharmacy Resident 04/24/2023 2:08 PM

## 2023-04-24 NOTE — H&P (Signed)
NAME:  Stacy Wood, MRN:  [id number], DOB:  05-19-2001, LOS: 0 ADMISSION DATE:  04/24/2023, Primary: System, Provider Not In  CHIEF COMPLAINT:  lethargy   Medical Service: Internal Medicine Teaching Service         Attending Physician: Dr. Reymundo Poll, MD    First Contact: Lyda Kalata, MSIV    Second Contact: Dr. Rudene Christians Pager: [phone]       After Hours (After 5p/  First Contact Pager: [phone]  weekends / holidays): Second Contact Pager: [phone]   HISTORY OF PRESENT ILLNESS   Stacy Wood is 22yo person with type I diabetes mellitus on insulin pump presenting to [facility] after lethargy and increase work of breathing upon waking up this morning. History largely obtained by patient's friend at bedside. She currently lives in [place], [place] with her mother but has been visiting her friends here in [employer] [place] for the last few days. Yesterday patient was in her normal state of health when she put in a new pod for her insulin pump. She went to bed in her normal state. This morning, patient was found lethargic and breathing rapidly. She did have some nausea and an episode of vomiting this morning as well. Patient's friend subsequently called EMS for further evaluation. No reported recent fevers, chills, night sweats, rashes, chest pain, dyspnea, cough, dysuria. She has had some recent weight loss (unsure of quantity) but thought to be related to her birth control. She has had multiple bouts of DKA with hospitalizations in [place].   PCP: System, Provider Not In  ED COURSE   Patient arrived to [facility] afebrile, tachycardic, normotensive, sating well on room air. Initial work-up notable for severe hyperglycemia, significant leukocytosis of 53k with >20% bands, pH 6.9 on VBG. DKA protocol with Endotool was initiated and IMTS was subsequently consulted for admission.   PAST MEDICAL HISTORY   She,  has a past medical history of Diabetes mellitus type I (HCC).   HOME MEDICATIONS    Prior to Admission medications   Medication Sig Start Date End Date Taking? Authorizing Provider  acetaminophen (TYLENOL) 325 MG tablet Take 2 tablets (650 mg total) by mouth every 6 (six) hours as needed. Do not take more than 4000mg  of tylenol per day Patient not taking: Reported on 01/12/2021 02/04/18   Couture, Cortni S, PA-C  Clindamycin Phos-Benzoyl Perox 1.2-3.75 % GEL Apply 1 application topically daily. Patient not taking: Reported on 02/20/2021 01/26/21   Lady Deutscher, MD  clindamycin-benzoyl peroxide [facility]) gel Apply topically daily. Patient not taking: Reported on 01/16/2021 01/12/21   Lady Deutscher, MD  Continuous Blood Gluc Receiver (DEXCOM G6 RECEIVER) DEVI 1 Device by Does not apply route daily as needed. Patient not taking: Reported on 01/16/2021 12/31/19   Gretchen Short, NP  Continuous Blood Gluc Sensor (DEXCOM G6 SENSOR) MISC 1 kit by Does not apply route daily as needed. Patient not taking: Reported on 01/16/2021 12/31/19   Gretchen Short, NP  Continuous Blood Gluc Transmit (DEXCOM G6 TRANSMITTER) MISC 1 kit by Does not apply route daily as needed. Patient not taking: Reported on 01/16/2021 12/31/19   Gretchen Short, NP  diphenhydrAMINE (BENADRYL) 25 MG tablet Take 1 tablet (25 mg total) by mouth every 6 (six) hours as needed. Patient not taking: Reported on 01/16/2021 01/12/19   Lorin Picket, NP  famotidine (PEPCID) 20 MG tablet Take 1 tablet (20 mg total) by mouth 2 (two) times daily. Patient not taking: Reported on 12/29/2019 01/12/19   Lorin Picket, NP  glucagon (GLUCAGON EMERGENCY) 1 MG injection INJECT 1 MG IN ANTERIOR THIGH IF UNCONSCIOUS, UNRESPONSIVE, UNABLE TO SWALLOW AND/OR HAS A SEIZURE Patient not taking: Reported on 01/16/2021 09/17/19   Gretchen Short, NP  hydroquinone 4 % cream Apply topically 2 (two) times daily. Patient not taking: Reported on 12/18/2021 07/20/21   Lilland, Alana, DO  ibuprofen (ADVIL,MOTRIN) 400 MG tablet Take 1 tablet (400 mg total) by  mouth every 6 (six) hours as needed. Patient not taking: Reported on 01/16/2021 02/04/18   Couture, Cortni S, PA-C  insulin aspart (NOVOLOG FLEXPEN) 100 UNIT/ML FlexPen INJECT UP TO 50 UNITS UNDER THE SKIN EVERY DAY AS DIRECTED 10/16/19   Gretchen Short, NP  Insulin Human (INSULIN PUMP) SOLN Inject into the skin continuous. humalog Patient not taking: Reported on 08/26/2020    [provider]  insulin lispro (HUMALOG) 100 UNIT/ML injection USE 200 UNITS IN INSULIN PUMP EVERY 48 HOURS FOR DKA AND HYPERGLYCEMIC PROTOCOL 11/28/20   Gretchen Short, NP  Insulin Pen Needle 31G X 5 MM MISC Inject insulin via insulin pen 6 x daily Patient not taking: Reported on 01/16/2021 04/11/20   Gretchen Short, NP  lisinopril (ZESTRIL) 2.5 MG tablet Take 1 tablet (2.5 mg total) by mouth daily. Patient not taking: Reported on 01/12/2021 11/01/20   Gretchen Short, NP  metroNIDAZOLE (FLAGYL) 500 MG tablet Take 1 tablet (500 mg total) by mouth 2 (two) times daily. 01/01/22   Raelyn Mora, CNM  Norethindrone Acetate-Ethinyl Estrad-FE (LOESTRIN 24 FE) 1-20 MG-MCG(24) tablet Take 1 tablet by mouth daily. Patient not taking: Reported on 12/18/2021 12/13/21   Brand Males, CNM  Norethindrone Acetate-Ethinyl Estradiol (LOESTRIN) 1.5-30 MG-MCG tablet Take 1 tablet by mouth daily. Patient not taking: Reported on 12/13/2021 01/12/21   Lady Deutscher, MD  tretinoin (RETIN-A) 0.025 % gel Apply topically at bedtime. Patient not taking: Reported on 12/13/2021 01/26/21   Lady Deutscher, MD  triamcinolone cream (KENALOG) 0.1 % Apply 1 application topically 2 (two) times daily. Patient not taking: Reported on 01/12/2021 01/12/19   Lorin Picket, NP    ALLERGIES   Allergies as of 04/24/2023   (No Known Allergies)    SOCIAL HISTORY   Patient lives in [place], [place] with mother. No tobacco, alcohol, or drug use reported.   FAMILY HISTORY   Her family history includes Asthma in her cousin; Diabetes in her maternal aunt and  maternal grandmother; Diabetes Mellitus I in her paternal aunt; Diabetes Mellitus II in her maternal grandmother, paternal grandmother, and paternal uncle; Hypertension in her maternal grandfather and mother.   REVIEW OF SYSTEMS   ROS per history of present illness.  PHYSICAL EXAMINATION   Blood pressure 108/78, pulse (!) 129, temperature 97.8 F (36.6 C), temperature source Axillary, resp. rate 19, height 5\' 2"  (1.575 m), weight 70.8 kg, SpO2 100 %.    Filed Weights   04/24/23 1300  Weight: 70.8 kg   GENERAL: Young appearing person laying in bed in no acute distress HENT: Normocephalic, atraumatic. Moist mucous membranes. No cervical, submandibular, or supraclavicular lymphadenopathy present.  EYES: Vision grossly in tact. No scleral icterus or conjunctival injection.  CV: Tachycardic, regular rhythm. No murmurs appreciated. Warm extremities.  PULM: Normal work of breathing on room air. Clear to auscultation bilaterally.  GI: Abdomen soft, non-distended. Mild tenderness in epigastric and right upper quadrant. No Murphy's sign. Normoactive bowel sounds. No hepatomegaly or splenomegaly.  MSK: Normal bulk, tone. No peripheral edema noted.  SKIN: Warm, dry. No rashes or petechiae noted.  NEURO:  Lethargic but arousable, following commands. Grossly non-focal. Protecting airway.   SIGNIFICANT DIAGNOSTIC TESTS   ECG: Sinus tachycardia. No significant ST changes.   I personally reviewed patient's ECG with my interpretation as above.  CXR: Mild bibasilar atelectasis but no consolidation or effusions.   I personally reviewed patient's CXR with my interpretation as above.  RUQ ULTRASOUND  Normal right upper quadrant ultrasound.   CT ABDOMEN PELVIS WITH CONTRAST No acute intra-abdominal process. 1.1 cm appendicolith at the base of the appendix. No evidence of acute appendicitis.  LABS      Latest Ref Rng & Units 04/24/2023   11:43 AM 04/24/2023   11:42 AM 04/24/2023   10:10 AM  CBC   WBC 4.0 - 10.5 K/uL   41.3   Hemoglobin 12.0 - 15.0 g/dL 16.1  09.6  04.5   Hematocrit 36.0 - 46.0 % 48.0  47.0  48.6   Platelets 150 - 400 K/uL   411       Latest Ref Rng & Units 04/24/2023   12:01 PM 04/24/2023   11:43 AM 04/24/2023   11:42 AM  BMP  Glucose 70 - 99 mg/dL 409  811    BUN 6 - 20 mg/dL 22  25    Creatinine 9.14 - 1.00 mg/dL 7.82  9.56    Sodium 213 - 145 mmol/L 145  146  144   Potassium 3.5 - 5.1 mmol/L 5.3  4.8  4.7   Chloride 98 - 111 mmol/L 116  120    CO2 22 - 32 mmol/L <7     Calcium 8.9 - 10.3 mg/dL 9.8       CONSULTS   N/A  ASSESSMENT   Stacy Wood is 22yo person with type I diabetes mellitus admitted 5/15 to IMTS with diabetic ketoacidosis.   PLAN   Principal Problem:   DKA (diabetic ketoacidosis) (HCC)  #Diabetic ketoacidosis #Type I diabetes mellitus Per chart review patient has previous been in DKA after insulin pump failure. While this could be the case during her current presentation, I am concerned for a possible infection given her leukocytosis. Patient arrived in overt ketoacidosis with glucose >700, pH 6.9 with significant symptoms. She is still lethargic on my exam, but protecting her airway and following commands - I do not feel as though she requires ICU monitoring as of now. She was initiated on insulin gtt in the Emergency Department, we will continue with this. We have not been able to obtain an anion gap as most of her blood draws have been hemolyzed, likely due to severe dehydration from the ketoacidosis. Her sugars have now normalized in the 100's but I imagine she is still acidotic, would not stop insulin gtt until we have confirmation the anion gap is closed x2. Initially was hyperkalemic, will hold potassium repletion for now.  - Continue Endotool  - Continue IV fluids - Follow-up BMP q4h - Zofran as needed - NPO   #Neutrophilic predominant leukocytosis with >20% bands #Thrombocytosis Initial CBC with WBC 53,000. After IV  fluids, repeat CBC with diff revealed WBC 41k w/ PMN predominance and >20% bands - wonder how much of the leukocytosis is due to hemoconcentration due to severe dehydration. However, this level of leukocytosis seems out of proportion. It is possible she has an underlying infection playing a role as well. She has been afebrile since arrival, no obvious source. She is having mild upper abdominal tenderness in right upper quadrant and epigastric areas without signs of peritonitis or surgical abdomen.  Abdominal imaging has been unrevealing thus far. No known urinary symptoms. Per chart review she had a normal WBC just a month ago. She has no known B symptoms and does not have splenomegaly or lymphadenopathy on my exam. Peripheral smear with unremarkable morphology. Given she is type I diabetic and is high risk with her severe acidosis, I have started broad-spectrum antibiotics. Unfortunately, we were not able to obtain blood cultures prior to this. Hematology was initially consulted by the ED, was told they would see patient if significant leukocytosis persists after acute illness resolves.  - Continue broad-spectrum antibiotics with Zosyn and vancomycin - Follow-up blood and urine cultures - Follow-up repeat CBC in the morning  #Acute kidney injury #Anion gap metabolic acidosis with respiratory compensation   Mild AKI in the setting of severe dehydration. Anion gap was unable to be obtained, but is almost certainly elevated given her clinical presentation. Plan to continue with intravenous fluids and follow-up BMP every 4 hours as per DKA protocol.  - Continue IVF - Follow-up BMP in AM - Strict I/O  BEST PRACTICE   DIET: NPO IVF: D5-LR DVT PPX: lovenox BOWEL: na CODE: FULL FAM COM: Tried calling family in chart, unable to reach them. Discussed with patient's friends at bedside.  DISPO: Admit patient to Inpatient with expected length of stay greater than 2 midnights.  Evlyn Kanner,  MD Internal Medicine Resident PGY-3 Pager [phone] 04/24/2023 2:53 PM

## 2023-04-25 DIAGNOSIS — Z794 Long term (current) use of insulin: Secondary | ICD-10-CM

## 2023-04-25 DIAGNOSIS — B3731 Acute candidiasis of vulva and vagina: Secondary | ICD-10-CM | POA: Diagnosis not present

## 2023-04-25 DIAGNOSIS — D72829 Elevated white blood cell count, unspecified: Secondary | ICD-10-CM

## 2023-04-25 DIAGNOSIS — E1010 Type 1 diabetes mellitus with ketoacidosis without coma: Secondary | ICD-10-CM | POA: Diagnosis not present

## 2023-04-25 DIAGNOSIS — N179 Acute kidney failure, unspecified: Secondary | ICD-10-CM | POA: Diagnosis not present

## 2023-04-25 LAB — BASIC METABOLIC PANEL
Anion gap: 13 (ref 5–15)
Anion gap: 14 (ref 5–15)
Anion gap: 13 (ref 5–15)
Anion gap: 12 (ref 5–15)
Anion gap: 12 (ref 5–15)
Anion gap: 13 (ref 5–15)
BUN: 12 mg/dL (ref 6–20)
BUN: 11 mg/dL (ref 6–20)
BUN: 8 mg/dL (ref 6–20)
BUN: 9 mg/dL (ref 6–20)
BUN: 6 mg/dL (ref 6–20)
BUN: 7 mg/dL (ref 6–20)
CO2: 19 mmol/L — ABNORMAL LOW (ref 22–32)
CO2: 21 mmol/L — ABNORMAL LOW (ref 22–32)
CO2: 20 mmol/L — ABNORMAL LOW (ref 22–32)
CO2: 20 mmol/L — ABNORMAL LOW (ref 22–32)
CO2: 21 mmol/L — ABNORMAL LOW (ref 22–32)
CO2: 22 mmol/L (ref 22–32)
Calcium: 9.4 mg/dL (ref 8.9–10.3)
Calcium: 9.8 mg/dL (ref 8.9–10.3)
Calcium: 9.9 mg/dL (ref 8.9–10.3)
Calcium: 9.9 mg/dL (ref 8.9–10.3)
Calcium: 9.5 mg/dL (ref 8.9–10.3)
Calcium: 9.4 mg/dL (ref 8.9–10.3)
Chloride: 111 mmol/L (ref 98–111)
Chloride: 108 mmol/L (ref 98–111)
Chloride: 107 mmol/L (ref 98–111)
Chloride: 110 mmol/L (ref 98–111)
Chloride: 108 mmol/L (ref 98–111)
Chloride: 104 mmol/L (ref 98–111)
Creatinine, Ser: 1 mg/dL (ref 0.44–1.00)
Creatinine, Ser: 0.98 mg/dL (ref 0.44–1.00)
Creatinine, Ser: 0.94 mg/dL (ref 0.44–1.00)
Creatinine, Ser: 0.87 mg/dL (ref 0.44–1.00)
Creatinine, Ser: 0.82 mg/dL (ref 0.44–1.00)
Creatinine, Ser: 0.81 mg/dL (ref 0.44–1.00)
GFR, Estimated: >60 mL/min (ref >60)
GFR, Estimated: >60 mL/min (ref >60)
GFR, Estimated: >60 mL/min (ref >60)
GFR, Estimated: >60 mL/min (ref >60)
GFR, Estimated: >60 mL/min (ref >60)
GFR, Estimated: >60 mL/min (ref >60)
Glucose, Bld: 149 mg/dL — ABNORMAL HIGH (ref 70–99)
Glucose, Bld: 149 mg/dL — ABNORMAL HIGH (ref 70–99)
Glucose, Bld: 141 mg/dL — ABNORMAL HIGH (ref 70–99)
Glucose, Bld: 109 mg/dL — ABNORMAL HIGH (ref 70–99)
Glucose, Bld: 112 mg/dL — ABNORMAL HIGH (ref 70–99)
Glucose, Bld: 190 mg/dL — ABNORMAL HIGH (ref 70–99)
Potassium: 3.7 mmol/L (ref 3.5–5.1)
Potassium: 3.8 mmol/L (ref 3.5–5.1)
Potassium: 3.4 mmol/L — ABNORMAL LOW (ref 3.5–5.1)
Potassium: 3.8 mmol/L (ref 3.5–5.1)
Potassium: 3.2 mmol/L — ABNORMAL LOW (ref 3.5–5.1)
Potassium: 3.2 mmol/L — ABNORMAL LOW (ref 3.5–5.1)
Sodium: 143 mmol/L (ref 135–145)
Sodium: 143 mmol/L (ref 135–145)
Sodium: 140 mmol/L (ref 135–145)
Sodium: 142 mmol/L (ref 135–145)
Sodium: 141 mmol/L (ref 135–145)
Sodium: 139 mmol/L (ref 135–145)

## 2023-04-25 LAB — CBC WITH DIFFERENTIAL/PLATELET
Abs Immature Granulocytes: 0.28 10*3/uL — ABNORMAL HIGH (ref 0.00–0.07)
Basophils Absolute: 0 10*3/uL (ref 0.0–0.1)
Basophils Relative: 0 %
Eosinophils Absolute: 0 10*3/uL (ref 0.0–0.5)
Eosinophils Relative: 0 %
HCT: 45.4 % (ref 36.0–46.0)
Hemoglobin: 14.1 g/dL (ref 12.0–15.0)
Immature Granulocytes: 1 %
Lymphocytes Relative: 10 %
Lymphs Abs: 2.2 10*3/uL (ref 0.7–4.0)
MCH: 24.4 pg — ABNORMAL LOW (ref 26.0–34.0)
MCHC: 31.1 g/dL (ref 30.0–36.0)
MCV: 78.4 fL — ABNORMAL LOW (ref 80.0–100.0)
Monocytes Absolute: 1.3 10*3/uL — ABNORMAL HIGH (ref 0.1–1.0)
Monocytes Relative: 6 %
Neutro Abs: 18.3 10*3/uL — ABNORMAL HIGH (ref 1.7–7.7)
Neutrophils Relative %: 83 %
Platelets: 345 10*3/uL (ref 150–400)
RBC: 5.79 MIL/uL — ABNORMAL HIGH (ref 3.87–5.11)
RDW: 14.1 % (ref 11.5–15.5)
WBC: 22.1 10*3/uL — ABNORMAL HIGH (ref 4.0–10.5)
nRBC: 0 % (ref 0.0–0.2)

## 2023-04-25 LAB — GLUCOSE, CAPILLARY
Glucose-Capillary: 100 mg/dL — ABNORMAL HIGH (ref 70–99)
Glucose-Capillary: 102 mg/dL — ABNORMAL HIGH (ref 70–99)
Glucose-Capillary: 111 mg/dL — ABNORMAL HIGH (ref 70–99)
Glucose-Capillary: 115 mg/dL — ABNORMAL HIGH (ref 70–99)
Glucose-Capillary: 119 mg/dL — ABNORMAL HIGH (ref 70–99)
Glucose-Capillary: 121 mg/dL — ABNORMAL HIGH (ref 70–99)
Glucose-Capillary: 134 mg/dL — ABNORMAL HIGH (ref 70–99)
Glucose-Capillary: 141 mg/dL — ABNORMAL HIGH (ref 70–99)
Glucose-Capillary: 147 mg/dL — ABNORMAL HIGH (ref 70–99)
Glucose-Capillary: 148 mg/dL — ABNORMAL HIGH (ref 70–99)
Glucose-Capillary: 149 mg/dL — ABNORMAL HIGH (ref 70–99)
Glucose-Capillary: 150 mg/dL — ABNORMAL HIGH (ref 70–99)
Glucose-Capillary: 152 mg/dL — ABNORMAL HIGH (ref 70–99)
Glucose-Capillary: 152 mg/dL — ABNORMAL HIGH (ref 70–99)
Glucose-Capillary: 161 mg/dL — ABNORMAL HIGH (ref 70–99)
Glucose-Capillary: 174 mg/dL — ABNORMAL HIGH (ref 70–99)
Glucose-Capillary: 183 mg/dL — ABNORMAL HIGH (ref 70–99)
Glucose-Capillary: 192 mg/dL — ABNORMAL HIGH (ref 70–99)
Glucose-Capillary: 197 mg/dL — ABNORMAL HIGH (ref 70–99)
Glucose-Capillary: 203 mg/dL — ABNORMAL HIGH (ref 70–99)
Glucose-Capillary: 264 mg/dL — ABNORMAL HIGH (ref 70–99)
Glucose-Capillary: 97 mg/dL (ref 70–99)

## 2023-04-25 LAB — HEMOGLOBIN A1C
Hgb A1c MFr Bld: 14.1 % — ABNORMAL HIGH (ref 4.8–5.6)
Mean Plasma Glucose: 357.97 mg/dL

## 2023-04-25 LAB — PHOSPHORUS: Phosphorus: 2.6 mg/dL (ref 2.5–4.6)

## 2023-04-25 LAB — MAGNESIUM: Magnesium: 2.2 mg/dL (ref 1.7–2.4)

## 2023-04-25 MED ORDER — POTASSIUM CHLORIDE CRYS ER 20 MEQ PO TBCR
40.0000 meq | EXTENDED_RELEASE_TABLET | Freq: Once | ORAL | Status: AC
  Administered 2023-04-25: 40 meq via ORAL
  Filled 2023-04-25: qty 2

## 2023-04-25 MED ORDER — POTASSIUM CHLORIDE CRYS ER 20 MEQ PO TBCR
30.0000 meq | EXTENDED_RELEASE_TABLET | Freq: Once | ORAL | Status: AC
  Administered 2023-04-25: 30 meq via ORAL
  Filled 2023-04-25: qty 1

## 2023-04-25 MED ORDER — INSULIN GLARGINE-YFGN 100 UNIT/ML ~~LOC~~ SOLN
36.0000 [IU] | Freq: Every day | SUBCUTANEOUS | Status: DC
  Administered 2023-04-25: 36 [IU] via SUBCUTANEOUS
  Filled 2023-04-25 (×3): qty 0.36

## 2023-04-25 MED ORDER — FLUCONAZOLE 150 MG PO TABS
150.0000 mg | ORAL_TABLET | Freq: Every day | ORAL | Status: DC
  Administered 2023-04-25: 150 mg via ORAL
  Filled 2023-04-25 (×2): qty 1

## 2023-04-25 MED ORDER — VANCOMYCIN HCL 1750 MG/350ML IV SOLN
1750.0000 mg | INTRAVENOUS | Status: DC
  Administered 2023-04-25: 1750 mg via INTRAVENOUS
  Filled 2023-04-25: qty 350

## 2023-04-25 MED ORDER — POTASSIUM CHLORIDE CRYS ER 20 MEQ PO TBCR: 30.0000 meq | EXTENDED_RELEASE_TABLET | Freq: Once | ORAL | Status: DC

## 2023-04-25 NOTE — Progress Notes (Deleted)
.  RXV

## 2023-04-25 NOTE — Plan of Care (Signed)
  Problem: Fluid Volume: Goal: Ability to maintain a balanced intake and output will improve Outcome: Progressing   Problem: Health Behavior/Discharge Planning: Goal: Ability to manage health-related needs will improve Outcome: Progressing   Problem: Metabolic: Goal: Ability to maintain appropriate glucose levels will improve Outcome: Progressing   Problem: Health Behavior/Discharge Planning: Goal: Ability to manage health-related needs will improve Outcome: Progressing   Problem: Metabolic: Goal: Ability to maintain appropriate glucose levels will improve Outcome: Progressing

## 2023-04-25 NOTE — Inpatient Diabetes Management (Signed)
Inpatient Diabetes Program Recommendations  AACE/ADA: New Consensus Statement on Inpatient Glycemic Control (2015)  Target Ranges:  Prepandial:   less than 140 mg/dL      Peak postprandial:   less than 180 mg/dL (1-2 hours)      Critically ill patients:  140 - 180 mg/dL   Lab Results  Component Value Date   GLUCAP 147 (H) 04/25/2023   HGBA1C 13.5 (A) 08/26/2020    Review of Glycemic Control  Diabetes history: type 1 Outpatient Diabetes medications: Omnipod insulin pump, Dexcom Current orders for Inpatient glycemic control: IV insulin drip  Inpatient Diabetes Program Recommendations:   Spoke with patient at the bedside. Patient states that a friend is bringing the Omnipod reservoirs, insulin. Patient states that she has her Dexcom that tells her pump how to cover blood sugars, but it has not been working properly. Has been having issues with the app on her phone and possible States that her blood sugars have been "all over the place" recently. Noted that her HgbA1C is 14% as of last admission to [facility].   Recommend transitioning off IV insulin drip with Semglee 36 units 2 hours prior to stopping drip, when drip stopped,Novolog 0-6 units correction scale and then TID when eating, Novolog 0-5 units HS scale, Novolog 8 units TID with meals if eating at least 50% of meal.   For discharge: Lantus 36 units at HS Novolog 0-6 units correction scale TID, Novolog 0-5 units HS scale Novolog 8 units TID with meals if eating at least 50% of meal.   Smith Mince RN BSN CDE Diabetes Coordinator Pager: [phone]  8am-5pm

## 2023-04-25 NOTE — Progress Notes (Signed)
BMP has resulted, available for review in Epic. White, MD notified.

## 2023-04-25 NOTE — Progress Notes (Addendum)
Received page from Amy, RN with more context regarding events preceding patient's arrival to ED. Patient explained that she had been at a birthday party and had a large amount of tequila prior to waking up in the hospital in DKA. Some concern that this information may have been withheld initially due to family being present which they have not been in the late evening/early morning hours allowing care team to learn this information.  Discussed plan for titration off of Endotool once anion gap <12 x2, thus far this has not occurred.  Champ Mungo, DO

## 2023-04-25 NOTE — Progress Notes (Addendum)
Subjective:  No significant overnight events. Patient reports feeling much better this morning. She is starting to have an appetite. She reports suprapubic pressure with urination and vaginal discharge. She has not had a menstrual cycle in 2 months. She denies fevers, chills, dysuria, hematuria, nausea, vomiting, diarrhea, and constipation.  Objective:  Vital signs in last 24 hours: Vitals:   04/24/23 2024 04/25/23 0009 04/25/23 0458 04/25/23 1124  BP: 107/67 108/66 119/73 101/65  Pulse: (!) 120 (!) 110 (!) 109 (!) 103  Resp: 16 17 17 18   Temp:  98.1 F (36.7 C) 98.2 F (36.8 C) 99 F (37.2 C)  TempSrc: Oral Oral Oral Oral  SpO2: 100% 100% 100% 100%  Weight:      Height:       Intake/Output Summary (Last 24 hours) at 04/25/2023 1324 Last data filed at 04/25/2023 0700 Gross per 24 hour  Intake 2814.07 ml  Output --  Net 2814.07 ml   Physical Exam: Constitutional: Young female lying in bed. Appears comfortable and in no acute distress.  Cardiovascular: Tachycardic rate, regular rhythm. No murmurs, rubs, or gallops. No LE edema.  Pulmonary: Normal respiratory effort on room air. Abdominal/GU: Soft. Non-distended. No abdominal tenderness. Mild suprapubic tenderness to palpation. Normal bowel sounds.    Neurological: Alert and oriented to person, place, and time. Non-focal. Skin: Warm and dry.    Assessment/Plan:  Principal Problem:   DKA (diabetic ketoacidosis) (HCC) Active Problems:   Insulin pump titration   Type 1 diabetes mellitus with hyperglycemia (HCC)   Leukocytosis  Stacy Wood is 22 y.o. female living with type I diabetes mellitus who presented to the ED with lethargy and increased work of breathing and admitted for diabetic ketoacidosis.   #Diabetic ketoacidosis #Anion gap metabolic acidosis with respiratory compensation #Type I diabetes mellitus Patient feeling much better today. Blood glucose stable in the 100s. She is improving on Endotool protocol but  remains acidotic with anion gap 13 at 0908 today. Hypokalemic with potassium 3.4, replaced with KCl 30 mEq tablet x 1. Will transition to basal-bolus insulin regimen once anion gap is closed x 2 since patient's Dexicom is not communicating with her insulin pump. Diabetes coordinator recommending Semglee 36 units 2 hours prior to stopping drip, when drip stopped, Novolog 0-6 units correction scale and then TID when eating, Novolog 0-5 units HS scale, Novolog 8 units TID with meals if eating at least 50% of meal. Will continue monitoring BMP Q4h. - Continue Endotool - Continue IVF with D5-LR - KCl 30 mEq tablet x 1 - Follow-up BMP Q4h - NPO  #Neutrophilic predominant leukocytosis with >20% bands #Thrombocytosis, resolved #Vaginal candidiasis  Leukocytosis still present with WBC 22.1 today. Patient remains afebrile. She has no clear signs or symptoms of localized infection. Patient only reports suprapubic pressure with urination and vaginal discharge. UA microscopy showed many bacteria, mucus, budding yeast, >50 RBC/hpf, and > 50 WBC/hpf. Will continue treating empirically with IV antibiotics until urine and blood cultures result. Will start fluconazole for vulvovaginal candidiasis. Will test for GC, chlamydia, and trichomonas. - IV Zosyn and vancomycin - Fluconazole 150 mg daily - Follow-up blood and urine cultures - Follow-up GC, chlamydia, trichomonas - Trend CBC  #Acute kidney injury  Creatinine remains elevated from baseline (0.6-0.7) at 0.94 today. Suspect likely secondary to severe dehydration from DKA. Will continue IVF with D5-LR. Will continue monitoring renal function. - Continue D5-LR 125 mL/hr - Trend BMP  Diet: NPO Bowel: None VTE: Lovenox IVF: D5-LR Code: Full LOS:  day 1  Prior to Admission Living Arrangement: home with mother Anticipated Discharge Location: home Barriers to Discharge: continued management  Stacy Wood, Medical Student 04/25/2023, 1:24 PM

## 2023-04-25 NOTE — Hospital Course (Addendum)
5/16: Feels a lot better, had some rest last night. Having polyuria but no dysuria or frank hematuria. Having some vaginal discharge ever since her UTI that she was admitted for a couple weeks ago. She also had a skin infection at that time mostly on her arms. She used some cream and it cleared up. No back pain currently but has been having pain intermittently. No diarrhea or constipation. Thinks she has insulin pump and omni pod with her. Thinks her basal rate is 2-3 units  5/17: Feeling better. Able to tolerate diet without nausea/vomiting. No fever, pain with urination, hematuria. No bloody bowel movements. Possibly menstruating. Patient is having issues with dexacom (?) however pump continues to work -- she just uses a manual meter. Patient manually checks 4-5 times per day and also carb counts.   Abdomen: mild tenderness to palpation diffusely

## 2023-04-25 NOTE — TOC Initial Note (Signed)
Transition of Care [facility]) - Initial/Assessment Note    Patient Details  Name: Stacy Wood MRN: [id number] Date of Birth: 2001/02/02  [facility]) CM/SW Contact:    Kermit Balo, RN Phone Number: 04/25/2023, 2:29 PM  Clinical Narrative:                 Pt is from home with her family. She denies any DME other than insulin pump.  She manages her medications and denies any issues.  She has needed transportation. Family will transport home when medically ready.   Expected Discharge Plan: Home/Self Care Barriers to Discharge: Continued Medical Work up   Patient Goals and CMS Choice            Expected Discharge Plan and Services       Living arrangements for the past 2 months: Single Family Home                                      Prior Living Arrangements/Services Living arrangements for the past 2 months: Single Family Home Lives with:: Parents Patient language and need for interpreter reviewed:: Yes Do you feel safe going back to the place where you live?: Yes            Criminal Activity/Legal Involvement Pertinent to Current Situation/Hospitalization: No - Comment as needed  Activities of Daily Living Home Assistive Devices/Equipment: CBG Meter ADL Screening (condition at time of admission) Patient's cognitive ability adequate to safely complete daily activities?: Yes Is the patient deaf or have difficulty hearing?: No Does the patient have difficulty seeing, even when wearing glasses/contacts?: No Does the patient have difficulty concentrating, remembering, or making decisions?: No Patient able to express need for assistance with ADLs?: Yes Does the patient have difficulty dressing or bathing?: No Independently performs ADLs?: Yes (appropriate for developmental age) Does the patient have difficulty walking or climbing stairs?: No Weakness of Legs: None Weakness of Arms/Hands: None  Permission Sought/Granted                   Emotional Assessment Appearance:: Appears stated age Attitude/Demeanor/Rapport: Engaged Affect (typically observed): Accepting Orientation: : Oriented to Self, Oriented to Place, Oriented to  Time, Oriented to Situation   Psych Involvement: No (comment)  Admission diagnosis:  Hyperkalemia [E87.5] DKA (diabetic ketoacidosis) (HCC) [E11.10] Bandemia [D72.825] AKI (acute kidney injury) (HCC) [N17.9] Type 1 diabetes mellitus with ketoacidosis without coma (HCC) [E10.10] Leukocytosis, unspecified type [D72.829] Patient Active Problem List   Diagnosis Date Noted   Leukocytosis 04/25/2023   DKA (diabetic ketoacidosis) (HCC) 04/24/2023   Post-inflammatory hyperpigmentation 07/20/2021   Failed vision screen 01/12/2021   Adjustment disorder with anxious mood 01/12/2021   Type 1 diabetes mellitus with hyperglycemia (HCC) 10/07/2020   Ketosis due to diabetes (HCC)    Elevated hemoglobin A1c 12/23/2018   Overweight, pediatric, BMI 85.0-94.9 percentile for age 20/19/2019   Poor sleep hygiene 06/27/2018   Allergic conjunctivitis 06/27/2018   Insulin pump titration 01/27/2018   DM w/o complication type I (HCC)    Hypoglycemia due to type 1 diabetes mellitus (HCC)    Hyperglycemia 09/12/2015   Maladaptive health behaviors affecting medical condition 01/10/2015   PCP:  System, Provider Not In Pharmacy:   [id number] - [place],  - [address] AT [facility] [address]  [place] [address] Phone: [phone] Fax: [phone]  [place]  [facility] [id number] - [place], [place] - [address] [place] [address] Phone: [phone] Fax: [phone]     Social Determinants of Health (SDOH) Social History: SDOH Screenings   Tobacco Use: Low Risk  (04/24/2023)   SDOH Interventions:     Readmission Risk Interventions     No data to display

## 2023-04-25 NOTE — Progress Notes (Signed)
Pharmacy Antibiotic Note  Stacy Wood is a 22 y.o. female admitted on 04/24/2023 with sepsis.  Pharmacy has been consulted for vancomycin dosing.  Gave  vancomycin 1500  mg load in the ED on 04/24/2023 Vancomycin variable dosing for unstable renal function was utilized in making this clinical decision. Renal function now improved.  Plan: - Start vancomycin 1750 mg IV q24hrs (eAUC 507 using SCr 0.94, Vd 0.72, and IBW) - Monitor renal function, cultures, and overall clinical picture  - Vancomycin levels as needed   Height: 5\' 2"  (157.5 cm) Weight: 70.8 kg (156 lb) IBW/kg (Calculated) : 50.1  Temp (24hrs), Avg:98.5 F (36.9 C), Min:98 F (36.7 C), Max:99 F (37.2 C)  Recent Labs  Lab 04/24/23 0854 04/24/23 0933 04/24/23 1010 04/24/23 1143 04/24/23 1201 04/24/23 2320 04/25/23 0206 04/25/23 0509 04/25/23 0908  WBC 53.3*  --  41.3*  --   --   --   --  22.1*  --   CREATININE  --    < >  --    < > 1.69* 0.97 1.00 0.98 0.94   < > = values in this interval not displayed.    Estimated Creatinine Clearance: 88.7 mL/min (by C-G formula based on SCr of 0.94 mg/dL).    No Known Allergies  Antimicrobials this admission: Zosyn 5/15 >>  fluconazole 5/16 >>  Vancomycin 5/16 >>   Dose adjustments this admission: N/A  Microbiology results: 5/15 BCx: NG <24hrs 5/15 UCx: in process    Thank you for allowing pharmacy to be a part of this patient's care.  Elicia Lamp, PharmD, CPP 04/25/2023 1:25 PM

## 2023-04-26 ENCOUNTER — Encounter: Payer: Self-pay | Admitting: Internal Medicine

## 2023-04-26 ENCOUNTER — Other Ambulatory Visit (HOSPITAL_COMMUNITY): Payer: Self-pay

## 2023-04-26 LAB — CERVICOVAGINAL ANCILLARY ONLY
Candida Glabrata: POSITIVE — AB
Candida Vaginitis: POSITIVE — AB
Chlamydia: POSITIVE — AB
Comment: NEGATIVE
Comment: NEGATIVE
Comment: NEGATIVE
Comment: NEGATIVE
Comment: NORMAL
Neisseria Gonorrhea: NEGATIVE
Trichomonas: POSITIVE — AB

## 2023-04-26 LAB — BASIC METABOLIC PANEL
Anion gap: 12 (ref 5–15)
Anion gap: 13 (ref 5–15)
BUN: 8 mg/dL (ref 6–20)
BUN: 7 mg/dL (ref 6–20)
CO2: 21 mmol/L — ABNORMAL LOW (ref 22–32)
CO2: 22 mmol/L (ref 22–32)
Calcium: 8.6 mg/dL — ABNORMAL LOW (ref 8.9–10.3)
Calcium: 8.9 mg/dL (ref 8.9–10.3)
Chloride: 102 mmol/L (ref 98–111)
Chloride: 108 mmol/L (ref 98–111)
Creatinine, Ser: 0.88 mg/dL (ref 0.44–1.00)
Creatinine, Ser: 0.77 mg/dL (ref 0.44–1.00)
GFR, Estimated: >60 mL/min (ref >60)
GFR, Estimated: >60 mL/min (ref >60)
Glucose, Bld: 348 mg/dL — ABNORMAL HIGH (ref 70–99)
Glucose, Bld: 123 mg/dL — ABNORMAL HIGH (ref 70–99)
Potassium: 3.3 mmol/L — ABNORMAL LOW (ref 3.5–5.1)
Potassium: 4.4 mmol/L (ref 3.5–5.1)
Sodium: 135 mmol/L (ref 135–145)
Sodium: 143 mmol/L (ref 135–145)

## 2023-04-26 LAB — GLUCOSE, CAPILLARY
Glucose-Capillary: 159 mg/dL — ABNORMAL HIGH (ref 70–99)
Glucose-Capillary: 284 mg/dL — ABNORMAL HIGH (ref 70–99)
Glucose-Capillary: 165 mg/dL — ABNORMAL HIGH (ref 70–99)
Glucose-Capillary: 100 mg/dL — ABNORMAL HIGH (ref 70–99)

## 2023-04-26 LAB — CBC
HCT: 35 % — ABNORMAL LOW (ref 36.0–46.0)
Hemoglobin: 10.9 g/dL — ABNORMAL LOW (ref 12.0–15.0)
MCH: 24 pg — ABNORMAL LOW (ref 26.0–34.0)
MCHC: 31.1 g/dL (ref 30.0–36.0)
MCV: 76.9 fL — ABNORMAL LOW (ref 80.0–100.0)
Platelets: 273 10*3/uL (ref 150–400)
RBC: 4.55 MIL/uL (ref 3.87–5.11)
RDW: 14 % (ref 11.5–15.5)
WBC: 14.2 10*3/uL — ABNORMAL HIGH (ref 4.0–10.5)
nRBC: 0 % (ref 0.0–0.2)

## 2023-04-26 LAB — URINE CULTURE: Culture: 40000 — AB

## 2023-04-26 LAB — MAGNESIUM: Magnesium: 1.6 mg/dL — ABNORMAL LOW (ref 1.7–2.4)

## 2023-04-26 LAB — CULTURE, BLOOD (ROUTINE X 2)

## 2023-04-26 LAB — HIV ANTIBODY (ROUTINE TESTING W REFLEX): HIV Screen 4th Generation wRfx: NONREACTIVE

## 2023-04-26 MED ORDER — MAGNESIUM SULFATE 2 GM/50ML IV SOLN
2.0000 g | Freq: Once | INTRAVENOUS | Status: AC
  Administered 2023-04-26: 2 g via INTRAVENOUS
  Filled 2023-04-26: qty 50

## 2023-04-26 MED ORDER — INSULIN ASPART 100 UNIT/ML IJ SOLN
8.0000 [IU] | Freq: Three times a day (TID) | INTRAMUSCULAR | Status: DC
  Administered 2023-04-26 (×2): 8 [IU] via SUBCUTANEOUS

## 2023-04-26 MED ORDER — CEPHALEXIN 500 MG PO CAPS
500.0000 mg | ORAL_CAPSULE | Freq: Two times a day (BID) | ORAL | 0 refills | Status: AC
  Filled 2023-04-26: qty 4, 2d supply, fill #0

## 2023-04-26 MED ORDER — PEN NEEDLES 32G X 4 MM MISC
0 refills | Status: AC
  Filled 2023-04-26: qty 100, 30d supply, fill #0

## 2023-04-26 MED ORDER — INSULIN ASPART 100 UNIT/ML IJ SOLN
0.0000 [IU] | INTRAMUSCULAR | Status: DC
  Administered 2023-04-26: 3 [IU] via SUBCUTANEOUS
  Administered 2023-04-26: 8 [IU] via SUBCUTANEOUS

## 2023-04-26 MED ORDER — INSULIN ASPART 100 UNIT/ML FLEXPEN
8.0000 [IU] | PEN_INJECTOR | Freq: Three times a day (TID) | SUBCUTANEOUS | 0 refills | Status: AC
  Filled 2023-04-26: qty 15, 30d supply, fill #0
  Filled 2023-04-26: qty 6, 25d supply, fill #0

## 2023-04-26 MED ORDER — POTASSIUM CHLORIDE CRYS ER 20 MEQ PO TBCR
40.0000 meq | EXTENDED_RELEASE_TABLET | Freq: Once | ORAL | Status: AC
  Administered 2023-04-26: 40 meq via ORAL
  Filled 2023-04-26: qty 2

## 2023-04-26 MED ORDER — INSULIN GLARGINE 100 UNIT/ML SOLOSTAR PEN
40.0000 [IU] | PEN_INJECTOR | Freq: Every day | SUBCUTANEOUS | 0 refills | Status: AC
  Filled 2023-04-26: qty 9, 22d supply, fill #0

## 2023-04-26 MED ORDER — SODIUM CHLORIDE 0.9 % IV SOLN: 1.0000 g | INTRAVENOUS | Status: DC

## 2023-04-26 NOTE — Discharge Instructions (Signed)
You were hospitalized for DKA. You were treated with IV insulin and transition to subcutaneous insulin. Given issues with your pump you should continue insulin injections until you can see your endocrinologist. You should see them as soon as possible. You can use the mealtime insulin scheduled for now and resume carb counting when you see the endocrinologist.You were also treated with IV antibiotics for a UTI, you will complete oral antibiotics at home. You were treated for a yeast infection as well. At home please make these changes:  Inject 40 units of insulin glargine nightly Inject 8 units of insulin aspart(novolog) with meals Take Keflex 500mg  one tablet twice daily(2 tablets total daily) for two total days starting 5/18/22024 Continue to check blood sugar first thing in the AM fasting, before meals, and before bed Follow up with endocrinology as soon as possible Schedule a primary care provider appointment within 1-2 weeks It was a pleasure caring for you!

## 2023-04-26 NOTE — TOC Transition Note (Signed)
Transition of Care [facility]) - CM/SW Discharge Note   Patient Details  Name: Stacy Wood MRN: [id number] Date of Birth: 02/12/2001  [facility] CM/SW Contact:  Kermit Balo, RN Phone Number: 04/26/2023, 11:25 AM   Clinical Narrative:    Pt is discharging home with self care. No needs per TOC.   Final next level of care: Home/Self Care Barriers to Discharge: No Barriers Identified   Patient Goals and CMS Choice      Discharge Placement                         Discharge Plan and Services Additional resources added to the After Visit Summary for                                       Social Determinants of Health (SDOH) Interventions SDOH Screenings   Food Insecurity: No Food Insecurity (04/25/2023)  Housing: Low Risk  (04/25/2023)  Transportation Needs: No Transportation Needs (04/25/2023)  Utilities: Not At Risk (04/25/2023)  Tobacco Use: Low Risk  (04/24/2023)     Readmission Risk Interventions     No data to display

## 2023-04-26 NOTE — Discharge Summary (Signed)
Name: Stacy Wood MRN: [id number] DOB: [birth year] 22 y.o. PCP: System, Provider Not In  Date of Admission: 04/24/2023  8:26 AM Date of Discharge: 04/26/2023 Attending Physician: Reymundo Poll, MD  Discharge Diagnosis: 1. Principal Problem:   DKA (diabetic ketoacidosis) (HCC) Active Problems:   Insulin pump titration   Type 1 diabetes mellitus with hyperglycemia (HCC)   Leukocytosis   Discharge Medications: Allergies as of 04/26/2023   No Known Allergies      Medication List     STOP taking these medications    insulin glargine 100 UNIT/ML injection Commonly known as: LANTUS Replaced by: insulin glargine 100 UNIT/ML Solostar Pen   insulin lispro 100 UNIT/ML injection Commonly known as: HUMALOG   insulin pump Soln       TAKE these medications    cephALEXin 500 MG capsule Commonly known as: KEFLEX Take 1 capsule (500 mg total) by mouth 2 (two) times daily for 2 days. Start taking on: Apr 27, 2023   Dexcom G6 Receiver Devi 1 Device by Does not apply route daily as needed.   Dexcom G6 Sensor Misc 1 kit by Does not apply route daily as needed.   Dexcom G6 Transmitter Misc 1 kit by Does not apply route daily as needed.   Glucagon Emergency 1 MG Kit INJECT 1 MG IN ANTERIOR THIGH IF UNCONSCIOUS, UNRESPONSIVE, UNABLE TO SWALLOW AND/OR HAS A SEIZURE   insulin aspart 100 UNIT/ML FlexPen Commonly known as: NOVOLOG Inject 8 Units into the skin 3 (three) times daily with meals.   insulin glargine 100 UNIT/ML Solostar Pen Commonly known as: LANTUS Inject 40 Units into the skin at bedtime. Replaces: insulin glargine 100 UNIT/ML injection   Pen Needles 32G X 4 MM Misc Use 1 new pen needle three times daily with meal time insulin, once nighlty with short acting night time insulin, once nightly with long acting basal insulin What changed:  medication strength additional instructions        Disposition and follow-up:   Ms.Courtny L Khawaja was  discharged from [facility] in Good condition.  At the hospital follow up visit please address:  1.   #Neutrophilic predominant leukocytosis with >20% bands #UTI #Vaginal candidiasis  #pyuria, hematuria -repeat urinalysis to ensure resolution of hematuria - Follow-up blood and urine cultures - Follow-up GC, chlamydia, trichomonas -repeat CBC, consider repeating infectious work up and onc referral if persistent leukocytosis  AKI Hypokalemia Hypomagnesemia -repeat BMP  Microcytic anemia -f/u cbc -iron studies  2.  Labs / imaging needed at time of follow-up: CBC,BMP,iron studies  3.  Pending labs/ test needing follow-up: GC/chlamydia,trichomonas, urine culture, blood culture  Follow-up Appointments:  Follow up with your PCP within 1-2 weeks of hospital discharge. Follow up with your endocrinologist as soon as possible.  Hospital Course by problem list: Stacy Wood is 22 y.o. female living with type I diabetes mellitus who presented to the ED with lethargy and increased work of breathing and admitted for diabetic ketoacidosis.    #Diabetic ketoacidosis #Anion gap metabolic acidosis with respiratory compensation #Type I diabetes mellitus Patient arrived in overt ketoacidosis with glucose >700, pH 6.9, bicarb around 5 and anion gap to 18. 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 Of note her WBC count on admission was  53.3 thousand but without fever. Her CT abdomen pelvis and RUQ [name] was without abnormality, her pregnancy test was normal, she had no evidence of pulmonary  infection clinically or on cxr, she had no symptoms of GI illness, she had no skin infection. She had some pressure with urination and UA consistent with UTI. One month ago had similar presentation and urine culture at that time without source, only contamination. UTI could be the reason for DKA but again  unclear. Patient was put on endotool with insulin drip until anion gap x 2 closed and she was eating. She was transitioned off of endotool to 36u glargine basal insulin and novolog 8u TID with meals. Her fasting glucose was around 280 after transition to subcutaneous insulin. Given issues with her insulin pump she was discharged with the subcutaneous insulin as opposed to the pump. It appears this was the case in 2 prior hospitalizations as well. Given high fasting her glargine was increased to 40u nightly and her mealtime at discharge was novolog 8 units given good glucose control throughout the day. Her electrolyte abnormalities including hyokalemia and hypomagnesemia were monitored and repleted. She received IV fluid hydration as well.At discharge patient was tolerating oral intake and her bicarb was 21 with AG of 12. She was also seen by the diabetes educator during admission.    #Neutrophilic predominant leukocytosis with >20% bands #Thrombocytosis, resolved #Vaginal candidiasis  Initial CBC with WBC 53,000. After IV fluids, repeat CBC with diff revealed WBC 41k w/ PMN predominance and >20% bands, smear with no abnormal morphology and differential without abnormal cell lines. Onc initially consulted by ED but instructed to reach out again only if persistent WBC elevation after acute infection.Unclear how much of the leukocytosis could be attributed to cortisol response and hemoconcentration. Only potential identifiable infectious source was UTI given consistent UA and pressure with urination:UA microscopy showed many bacteria, mucus, budding yeast, >50 RBC/hpf, and > 50 WBC/hpf.. At discharge, urine culture was still pending but blood cultures were without growth. WBC at discharge down trended to 14.2. Given her high risk with T1DM and DKA, elected to treat UTI. She was treated with 2 days of IV vanc and zosy, and 1 day of IV ceftriaxone. At discharge she was given keflex to complete 5 day treatment  course. Also treated with diflucan given vaginal discharge and yeast on urine microscopy. GC/Chlamydia tests pending at discharge.    #Acute kidney injury  Creatinine elevated to 1.1 on admission in the setting of DKA. BMP trended during admission and IVF continued through admission until PO intake tolerated. At discharge creatinine was 0.88.  Microcytic anemia Hemoglobin on admission 14.3 with MCV of 89.3. At discharge hemoglobin was 10.9 with MCV of 76.9. No evidence of GI bleeding. Patient did endorse menses during admission. Also suspect some hemodilution from IVF. Can perform iron studies and repeat CBC in the outpatient setting.   Subjective: Feeling better. Able to tolerate diet without nausea/vomiting. No fever, pain with urination, hematuria. No bloody bowel movements. Possibly menstruating. Patient is having issues with dexacom (?) however pump continues to work -- she just uses a manual meter. Patient manually checks 4-5 times per day and also carb counts.  Discharge Exam:   BP 114/72   Pulse 91   Temp 97.7 F (36.5 C) (Oral)   Resp 17   Ht 5\' 2"  (1.575 m)   Wt 70.8 kg   SpO2 100%   BMI 28.53 kg/m  Discharge exam:  Constitutional: Young female lying in bed. Appears comfortable and in no acute distress.  Cardiovascular: regular rate, regular rhythm. No murmurs, rubs, or gallops. No LE edema.  Pulmonary: Normal respiratory  effort on room air. Abdominal/GU: Soft. Non-distended.Minimal lower abdominal tenderness. Mild suprapubic tenderness to palpation. Normal bowel sounds.    Neurological: Alert and oriented to person, place, and time. Non-focal. Skin: Warm and dry.    Pertinent Labs, Studies, and Procedures:     Latest Ref Rng & Units 04/26/2023    3:01 AM 04/25/2023    5:09 AM 04/24/2023   11:43 AM  CBC  WBC 4.0 - 10.5 K/uL 14.2  22.1    Hemoglobin 12.0 - 15.0 g/dL 09.8  11.9  14.7   Hematocrit 36.0 - 46.0 % 35.0  45.4  48.0   Platelets 150 - 400 K/uL 273  345          Latest Ref Rng & Units 04/26/2023    3:01 AM 04/25/2023    9:11 PM 04/25/2023    7:09 PM  BMP  Glucose 70 - 99 mg/dL 829  562  130   BUN 6 - 20 mg/dL 8  7  6    Creatinine 0.44 - 1.00 mg/dL 8.65  7.84  6.96   Sodium 135 - 145 mmol/L 135  139  141   Potassium 3.5 - 5.1 mmol/L 3.3  3.2  3.2   Chloride 98 - 111 mmol/L 102  104  108   CO2 22 - 32 mmol/L 21  22  21    Calcium 8.9 - 10.3 mg/dL 8.6  9.4  9.5    CT ABDOMEN PELVIS W CONTRAST  Result Date: 04/24/2023 CLINICAL DATA:  DKA.  Fatigue, nausea, and vomiting. EXAM: CT ABDOMEN AND PELVIS WITH CONTRAST TECHNIQUE: Multidetector CT imaging of the abdomen and pelvis was performed using the standard protocol following bolus administration of intravenous contrast. RADIATION DOSE REDUCTION: This exam was performed according to the departmental dose-optimization program which includes automated exposure control, adjustment of the mA and/or kV according to patient size and/or use of iterative reconstruction technique. CONTRAST:  65mL OMNIPAQUE IOHEXOL 350 MG/ML SOLN COMPARISON:  Right upper quadrant ultrasound from same day. FINDINGS: Lower chest: No acute abnormality. Hepatobiliary: No focal liver abnormality is seen. No gallstones, gallbladder wall thickening, or biliary dilatation. Pancreas: Unremarkable. No pancreatic ductal dilatation or surrounding inflammatory changes. Spleen: Normal in size without focal abnormality. Adrenals/Urinary Tract: Adrenal glands are unremarkable. Kidneys are normal, without renal calculi, focal lesion, or hydronephrosis. Bladder is unremarkable. Stomach/Bowel: Stomach is within normal limits. No evidence of bowel wall thickening, distention, or inflammatory changes. 1.1 cm appendicolith at the base of the appendix, which is otherwise normal in appearance without dilatation, wall thickening or surrounding inflammatory change. Vascular/Lymphatic: No significant vascular findings are present. No enlarged abdominal or pelvic lymph  nodes. Reproductive: Uterus and bilateral adnexa are unremarkable. Other: No free fluid or pneumoperitoneum. Musculoskeletal: No acute or significant osseous findings. IMPRESSION: 1. No acute intra-abdominal process. 2. 1.1 cm appendicolith at the base of the appendix. No evidence of acute appendicitis. Electronically Signed   By: Obie Dredge M.D.   On: 04/24/2023 14:36   DG Chest Portable 1 View  Result Date: 04/24/2023 CLINICAL DATA:  Sepsis EXAM: PORTABLE CHEST 1 VIEW COMPARISON:  10/27/2019 FINDINGS: The heart size and mediastinal contours are within normal limits. Both lungs are clear. The visualized skeletal structures are unremarkable. IMPRESSION: No active disease. Electronically Signed   By: Duanne Guess D.O.   On: 04/24/2023 13:27   US Abdomen Limited RUQ (LIVER/GB)  Result Date: 04/24/2023 CLINICAL DATA:  151471 RUQ pain 151471 EXAM: ULTRASOUND ABDOMEN LIMITED RIGHT UPPER QUADRANT COMPARISON:  None  Available. FINDINGS: Gallbladder: No gallstones or wall thickening visualized. No sonographic Murphy sign noted by sonographer. Common bile duct: Diameter: 4 mm, normal.  No intrahepatic ductal dilation. Liver: No focal lesion identified. Within normal limits in parenchymal echogenicity. Portal vein is patent on color Doppler imaging with normal direction of blood flow towards the liver. Other: None. IMPRESSION: Normal right upper quadrant ultrasound. Electronically Signed   By: Caprice Renshaw M.D.   On: 04/24/2023 13:00     Discharge Instructions: Discharge Instructions     Diet - low sodium heart healthy   Complete by: As directed    Increase activity slowly   Complete by: As directed      You were hospitalized for DKA. You were treated with IV insulin and transition to subcutaneous insulin. Given issues with your pump you should continue insulin injections until you can see your endocrinologist. You should see them as soon as possible. You can use the mealtime insulin scheduled for  now and resume carb counting when you see the endocrinologist.You were also treated with IV antibiotics for a UTI, you will complete oral antibiotics at home. You were treated for a yeast infection as well. At home please make these changes:  Inject 40 units of insulin glargine nightly Inject 8 units of insulin aspart(novolog) with meals Take Keflex 500mg  one tablet twice daily(2 tablets total daily) for two total days starting 5/18/22024 Continue to check blood sugar first thing in the AM fasting, before meals, and before bed Follow up with endocrinology as soon as possible Schedule a primary care provider appointment within 1-2 weeks It was a pleasure caring for you!  Signed: Willette Cluster, MD 04/26/2023, 11:18 AM   Pager: Willette Cluster, MD Internal Medicine Resident, PGY-1 [facility]  Pager: [phone]

## 2023-04-28 LAB — CULTURE, BLOOD (ROUTINE X 2): Culture: NO GROWTH

## 2023-04-29 LAB — CULTURE, BLOOD (ROUTINE X 2)
Culture: NO GROWTH
Special Requests: ADEQUATE

## 2023-04-30 ENCOUNTER — Telehealth: Payer: Self-pay | Admitting: Infectious Diseases

## 2023-04-30 LAB — CULTURE, BLOOD (ROUTINE X 2)

## 2023-04-30 NOTE — Telephone Encounter (Signed)
Called pt and left VM that she "had some positive tests she needs to get treatment for". Asked her to call back [phone]

## 2023-06-27 ENCOUNTER — Encounter (INDEPENDENT_AMBULATORY_CARE_PROVIDER_SITE_OTHER): Payer: Self-pay

## 2024-03-17 ENCOUNTER — Encounter (INDEPENDENT_AMBULATORY_CARE_PROVIDER_SITE_OTHER): Payer: Self-pay

## 2024-03-30 ENCOUNTER — Encounter (INDEPENDENT_AMBULATORY_CARE_PROVIDER_SITE_OTHER): Payer: Self-pay
